# Patient Record
Sex: Female | Born: 1955 | Race: Black or African American | Hispanic: No | Marital: Married | Smoking: Former smoker
Health system: Southern US, Community
[De-identification: ages and names within clinical notes are randomized; demographics above are authoritative.]

## PROBLEM LIST (undated history)

## (undated) DIAGNOSIS — K219 Gastro-esophageal reflux disease without esophagitis: Secondary | ICD-10-CM

## (undated) DIAGNOSIS — F419 Anxiety disorder, unspecified: Secondary | ICD-10-CM

## (undated) DIAGNOSIS — E119 Type 2 diabetes mellitus without complications: Secondary | ICD-10-CM

## (undated) DIAGNOSIS — I82409 Acute embolism and thrombosis of unspecified deep veins of unspecified lower extremity: Secondary | ICD-10-CM

## (undated) DIAGNOSIS — M1990 Unspecified osteoarthritis, unspecified site: Secondary | ICD-10-CM

## (undated) DIAGNOSIS — I10 Essential (primary) hypertension: Secondary | ICD-10-CM

## (undated) DIAGNOSIS — K449 Diaphragmatic hernia without obstruction or gangrene: Secondary | ICD-10-CM

## (undated) DIAGNOSIS — E079 Disorder of thyroid, unspecified: Secondary | ICD-10-CM

## (undated) DIAGNOSIS — G43909 Migraine, unspecified, not intractable, without status migrainosus: Secondary | ICD-10-CM

## (undated) DIAGNOSIS — K828 Other specified diseases of gallbladder: Secondary | ICD-10-CM

## (undated) DIAGNOSIS — A048 Other specified bacterial intestinal infections: Secondary | ICD-10-CM

## (undated) DIAGNOSIS — Z5189 Encounter for other specified aftercare: Secondary | ICD-10-CM

## (undated) DIAGNOSIS — K589 Irritable bowel syndrome without diarrhea: Secondary | ICD-10-CM

## (undated) DIAGNOSIS — T7840XA Allergy, unspecified, initial encounter: Secondary | ICD-10-CM

## (undated) DIAGNOSIS — J45909 Unspecified asthma, uncomplicated: Secondary | ICD-10-CM

## (undated) DIAGNOSIS — K269 Duodenal ulcer, unspecified as acute or chronic, without hemorrhage or perforation: Secondary | ICD-10-CM

## (undated) DIAGNOSIS — K635 Polyp of colon: Secondary | ICD-10-CM

## (undated) DIAGNOSIS — D689 Coagulation defect, unspecified: Secondary | ICD-10-CM

## (undated) DIAGNOSIS — D126 Benign neoplasm of colon, unspecified: Secondary | ICD-10-CM

## (undated) DIAGNOSIS — M797 Fibromyalgia: Secondary | ICD-10-CM

## (undated) DIAGNOSIS — E785 Hyperlipidemia, unspecified: Secondary | ICD-10-CM

## (undated) HISTORY — DX: Encounter for other specified aftercare: Z51.89

## (undated) HISTORY — DX: Duodenal ulcer, unspecified as acute or chronic, without hemorrhage or perforation: K26.9

## (undated) HISTORY — DX: Fibromyalgia: M79.7

## (undated) HISTORY — PX: CHOLECYSTECTOMY: SHX55

## (undated) HISTORY — DX: Unspecified asthma, uncomplicated: J45.909

## (undated) HISTORY — DX: Migraine, unspecified, not intractable, without status migrainosus: G43.909

## (undated) HISTORY — PX: LAPAROSCOPIC ABDOMINAL EXPLORATION: SHX6249

## (undated) HISTORY — PX: SIGMOIDOSCOPY: SUR1295

## (undated) HISTORY — PX: COLONOSCOPY: SHX174

## (undated) HISTORY — DX: Irritable bowel syndrome, unspecified: K58.9

## (undated) HISTORY — PX: ABDOMINAL HYSTERECTOMY: SHX81

## (undated) HISTORY — DX: Allergy, unspecified, initial encounter: T78.40XA

## (undated) HISTORY — DX: Other specified diseases of gallbladder: K82.8

## (undated) HISTORY — DX: Polyp of colon: K63.5

## (undated) HISTORY — DX: Benign neoplasm of colon, unspecified: D12.6

## (undated) HISTORY — DX: Acute embolism and thrombosis of unspecified deep veins of unspecified lower extremity: I82.409

## (undated) HISTORY — DX: Gastro-esophageal reflux disease without esophagitis: K21.9

## (undated) HISTORY — DX: Disorder of thyroid, unspecified: E07.9

## (undated) HISTORY — DX: Anxiety disorder, unspecified: F41.9

## (undated) HISTORY — DX: Unspecified osteoarthritis, unspecified site: M19.90

## (undated) HISTORY — DX: Hyperlipidemia, unspecified: E78.5

## (undated) HISTORY — DX: Other specified bacterial intestinal infections: A04.8

## (undated) HISTORY — DX: Essential (primary) hypertension: I10

## (undated) HISTORY — PX: TONSILLECTOMY: SUR1361

## (undated) HISTORY — DX: Diaphragmatic hernia without obstruction or gangrene: K44.9

## (undated) HISTORY — PX: THYROID SURGERY: SHX805

## (undated) HISTORY — DX: Coagulation defect, unspecified: D68.9

## (undated) HISTORY — DX: Type 2 diabetes mellitus without complications: E11.9

## (undated) HISTORY — PX: UPPER GASTROINTESTINAL ENDOSCOPY: SHX188

---

## 2004-07-24 ENCOUNTER — Ambulatory Visit: Payer: Self-pay | Admitting: Internal Medicine

## 2006-08-28 ENCOUNTER — Ambulatory Visit: Payer: Self-pay | Admitting: Internal Medicine

## 2006-09-20 ENCOUNTER — Encounter: Payer: Self-pay | Admitting: Internal Medicine

## 2006-09-20 ENCOUNTER — Ambulatory Visit: Payer: Self-pay | Admitting: Internal Medicine

## 2006-10-20 ENCOUNTER — Encounter: Payer: Self-pay | Admitting: Internal Medicine

## 2008-01-27 ENCOUNTER — Encounter: Payer: Self-pay | Admitting: Internal Medicine

## 2009-03-31 ENCOUNTER — Encounter: Payer: Self-pay | Admitting: Internal Medicine

## 2009-11-09 ENCOUNTER — Encounter (INDEPENDENT_AMBULATORY_CARE_PROVIDER_SITE_OTHER): Payer: Self-pay | Admitting: *Deleted

## 2009-11-09 ENCOUNTER — Telehealth: Payer: Self-pay | Admitting: Internal Medicine

## 2009-11-21 DIAGNOSIS — IMO0001 Reserved for inherently not codable concepts without codable children: Secondary | ICD-10-CM | POA: Insufficient documentation

## 2009-11-21 DIAGNOSIS — K449 Diaphragmatic hernia without obstruction or gangrene: Secondary | ICD-10-CM | POA: Insufficient documentation

## 2009-11-21 DIAGNOSIS — G43909 Migraine, unspecified, not intractable, without status migrainosus: Secondary | ICD-10-CM | POA: Insufficient documentation

## 2009-11-21 DIAGNOSIS — E039 Hypothyroidism, unspecified: Secondary | ICD-10-CM | POA: Insufficient documentation

## 2009-11-21 DIAGNOSIS — Z8711 Personal history of peptic ulcer disease: Secondary | ICD-10-CM

## 2009-11-21 DIAGNOSIS — K625 Hemorrhage of anus and rectum: Secondary | ICD-10-CM

## 2009-11-21 DIAGNOSIS — Z860100 Personal history of colon polyps, unspecified: Secondary | ICD-10-CM | POA: Insufficient documentation

## 2009-11-21 DIAGNOSIS — K589 Irritable bowel syndrome without diarrhea: Secondary | ICD-10-CM

## 2009-11-21 DIAGNOSIS — I10 Essential (primary) hypertension: Secondary | ICD-10-CM | POA: Insufficient documentation

## 2009-11-21 DIAGNOSIS — F411 Generalized anxiety disorder: Secondary | ICD-10-CM | POA: Insufficient documentation

## 2009-11-21 DIAGNOSIS — Z8719 Personal history of other diseases of the digestive system: Secondary | ICD-10-CM

## 2009-11-21 DIAGNOSIS — K5900 Constipation, unspecified: Secondary | ICD-10-CM | POA: Insufficient documentation

## 2009-11-21 DIAGNOSIS — R110 Nausea: Secondary | ICD-10-CM

## 2009-11-21 DIAGNOSIS — F450 Somatization disorder: Secondary | ICD-10-CM

## 2009-11-21 DIAGNOSIS — K828 Other specified diseases of gallbladder: Secondary | ICD-10-CM

## 2010-01-17 ENCOUNTER — Encounter: Payer: Self-pay | Admitting: Internal Medicine

## 2010-01-17 ENCOUNTER — Telehealth: Payer: Self-pay | Admitting: Internal Medicine

## 2010-02-02 ENCOUNTER — Ambulatory Visit: Payer: Self-pay | Admitting: Internal Medicine

## 2010-02-02 DIAGNOSIS — R109 Unspecified abdominal pain: Secondary | ICD-10-CM | POA: Insufficient documentation

## 2010-02-02 DIAGNOSIS — M545 Low back pain: Secondary | ICD-10-CM

## 2010-02-02 LAB — CONVERTED CEMR LAB
Basophils Relative: 0 % (ref 0–1)
Hemoglobin: 12.7 g/dL (ref 12.0–15.0)
Lymphocytes Relative: 64 % — ABNORMAL HIGH (ref 12–46)
MCHC: 32.6 g/dL (ref 30.0–36.0)
Monocytes Absolute: 0.5 10*3/uL (ref 0.1–1.0)
Monocytes Relative: 6 % (ref 3–12)
Neutro Abs: 2.4 10*3/uL (ref 1.7–7.7)
Neutrophils Relative %: 29 % — ABNORMAL LOW (ref 43–77)
RBC: 4.31 M/uL (ref 3.87–5.11)
WBC: 8.3 10*3/uL (ref 4.0–10.5)

## 2010-02-03 LAB — CONVERTED CEMR LAB
ALT: 34 units/L (ref 0–35)
BUN: 15 mg/dL (ref 6–23)
CO2: 34 meq/L — ABNORMAL HIGH (ref 19–32)
Calcium: 10.1 mg/dL (ref 8.4–10.5)
Chloride: 100 meq/L (ref 96–112)
Creatinine, Ser: 0.6 mg/dL (ref 0.4–1.2)
GFR calc non Af Amer: 132.46 mL/min (ref >60)
Glucose, Bld: 64 mg/dL — ABNORMAL LOW (ref 70–99)
Sed Rate: 26 mm/hr — ABNORMAL HIGH (ref 0–22)
TSH: 0.24 microintl units/mL — ABNORMAL LOW (ref 0.35–5.50)
Total Bilirubin: 0.3 mg/dL (ref 0.3–1.2)

## 2010-02-06 ENCOUNTER — Encounter: Payer: Self-pay | Admitting: Internal Medicine

## 2010-02-15 ENCOUNTER — Encounter: Payer: Self-pay | Admitting: Internal Medicine

## 2010-02-16 ENCOUNTER — Ambulatory Visit: Payer: Self-pay | Admitting: Internal Medicine

## 2010-02-20 ENCOUNTER — Encounter: Payer: Self-pay | Admitting: Internal Medicine

## 2010-02-21 ENCOUNTER — Telehealth (INDEPENDENT_AMBULATORY_CARE_PROVIDER_SITE_OTHER): Payer: Self-pay

## 2010-03-02 ENCOUNTER — Telehealth: Payer: Self-pay | Admitting: Internal Medicine

## 2010-03-03 ENCOUNTER — Telehealth: Payer: Self-pay | Admitting: Internal Medicine

## 2010-05-11 ENCOUNTER — Ambulatory Visit: Payer: Self-pay | Admitting: Internal Medicine

## 2010-06-27 NOTE — Procedures (Signed)
Summary: Upper Endoscopy  Patient: Mindie Rawdon Note: All result statuses are Final unless otherwise noted.  Tests: (1) Upper Endoscopy (EGD)   EGD Upper Endoscopy       DONE     [facility]     [address]     [place], [place]  [address]           ENDOSCOPY PROCEDURE REPORT           PATIENT:  Cyndia, Degraff  MR#:  [id number]     BIRTHDATE:  [birth year], 54 yrs. old  GENDER:  female           ENDOSCOPIST:  Hedwig Morton. Juanda Chance, MD     Referred by:  Christena Flake, M.D.           PROCEDURE DATE:  02/16/2010     PROCEDURE:  EGD with biopsy     ASA CLASS:  Class II     INDICATIONS:  nausea and vomiting duodenal ulcer in 2004,     duodenitis in 2008     she has been on Mobic           MEDICATIONS:   Versed 10 mg, Fentanyl 100 mcg     TOPICAL ANESTHETIC:  Exactacain Spray           DESCRIPTION OF PROCEDURE:   After the risks benefits and     alternatives of the procedure were thoroughly explained, informed     consent was obtained.  The LB GIF-H180 [id number] endoscope was     introduced through the mouth and advanced to the second portion of     the duodenum, without limitations.  The instrument was slowly     withdrawn as the mucosa was fully examined.     <<PROCEDUREIMAGES>>           Esophagitis was found in the distal esophagus. 2 linear erosion     With standard forceps, a biopsy was obtained and sent to pathology     (see image1, image2, image11, and image12).  Mild gastritis was     found. With standard forceps, a biopsy was obtained and sent to     pathology (see image8, image7, image9, and image10).  Duodenitis     was found. With standard forceps, a biopsy was obtained and sent     to pathology (see image6, image4, and image5).    Retroflexed     views revealed no abnormalities.    The scope was then withdrawn     from the patient and the procedure completed.           COMPLICATIONS:  None           ENDOSCOPIC IMPRESSION:     1) Esophagitis in the distal  esophagus     2) Mild gastritis     3) Duodenitis     s/p multiple biopsies, ? ethiology? r/o Mobic related,     RECOMMENDATIONS:     1) Await biopsy results     increase Prelosec to bid, # 60, 3 refills     Carafte 1 gm bid, continue           REPEAT EXAM:  In 0 year(s) for.           ______________________________     Hedwig Morton. Juanda Chance, MD           CC:           n.     eSIGNED:  Hedwig Morton. Brodie at 02/16/2010 03:19 PM           Page 2 of 3   Symia, Herdt, [id number]  Note: An exclamation mark (!) indicates a result that was not dispersed into the flowsheet. Document Creation Date: 02/16/2010 3:19 PM _______________________________________________________________________  (1) Order result status: Final Collection or observation date-time: 02/16/2010 15:07 Requested date-time:  Receipt date-time:  Reported date-time:  Referring Physician:   Ordering Physician: Lina Sar [phone]) Specimen Source:  Source: Launa Grill Order Number: [id number] Lab site:

## 2010-06-27 NOTE — Letter (Signed)
Summary: EGD Instructions  [facility]  [address] [place], [place] [address]   Phone: [phone]  Fax: [phone]       [facility] Riquelme    1956-02-09    MRN: [id number]       Procedure Day /Date:02/14/10 Tuesday     Arrival Time: 1:30 pm     Procedure Time: 2:30 pm     Location of Procedure:                    _ x_ [facility] (4th Floor)  PREPARATION FOR ENDOSCOPY   On 02/14/10 THE DAY OF THE PROCEDURE:  1.   No solid foods, milk or milk products are allowed after midnight the night before your procedure.  2.   Do not drink anything colored red or purple.  Avoid juices with pulp.  No orange juice.  3.  You may drink clear liquids until 12:30 pm, which is 2 hours before your procedure.                                                                                                CLEAR LIQUIDS INCLUDE: Water Jello Ice Popsicles Tea (sugar ok, no milk/cream) Powdered fruit flavored drinks Coffee (sugar ok, no milk/cream) Gatorade Juice: apple, white grape, white cranberry  Lemonade Clear bullion, consomm, broth Carbonated beverages (any kind) Strained chicken noodle soup Hard Candy   MEDICATION INSTRUCTIONS  Unless otherwise instructed, you should take regular prescription medications with a small sip of water as early as possible the morning of your procedure.                 OTHER INSTRUCTIONS  You will need a responsible adult at least 53 years of age to accompany you and drive you home.   This person must remain in the waiting room during your procedure.  Wear loose fitting clothing that is easily removed.  Leave jewelry and other valuables at home.  However, you may wish to bring a book to read or an iPod/MP3 player to listen to music as you wait for your procedure to start.  Remove all body piercing jewelry and leave at home.  Total time from sign-in until discharge is approximately 2-3 hours.  You should go  home directly after your procedure and rest.  You can resume normal activities the day after your procedure.  The day of your procedure you should not:   Drive   Make legal decisions   Operate machinery   Drink alcohol   Return to work  You will receive specific instructions about eating, activities and medications before you leave.    The above instructions have been reviewed and explained to me by  Lamona Curl CMA Duncan Dull)  February 02, 2010 12:36 PM     I fully understand and can verbalize these instructions _____________________________ Date 02/02/10

## 2010-06-27 NOTE — Progress Notes (Signed)
Summary: Triage  Medications Added FLUCONAZOLE 150 MG TABS (FLUCONAZOLE) 1 tablet by mouth       Phone Note Call from Patient Call back at Home Phone [phone]   Caller: Patient Call For: Dr. Juanda Chance Reason for Call: Talk to Nurse Summary of Call: Is on a antibotic and has a "yeast" infection...wants to know if something can be prescribed also requesting to speak to nurse Initial call taken by: Karna Christmas,  March 03, 2010 9:22 AM  Follow-up for Phone Call        Patient is being treaed for h pylori with a prev-pak.  Since starting on this she c/o vaginal yeast infection.  Patient  also states she has some diarrhea since starting antibiotics.  I have advised her to resume her donnatal as previously prescribed.  Dr Arlyce Dice you are MD of the day please advise tx for vaginal yeast infection. Follow-up by: Darcey Nora RN, CGRN,  March 03, 2010 9:32 AM  Additional Follow-up for Phone Call Additional follow up Details #1::        flucanazole 150mg  x 1 dose try florastor 1 once daily x 10 days Additional Follow-up by: Louis Meckel MD,  March 03, 2010 12:00 PM    Additional Follow-up for Phone Call Additional follow up Details #2::    I have left a voicemail with Dr Marzetta Board recommendations.  I have asked her to call me back if she has any questions. Follow-up by: Darcey Nora RN, CGRN,  March 03, 2010 1:12 PM  New/Updated Medications: FLUCONAZOLE 150 MG TABS (FLUCONAZOLE) 1 tablet by mouth Prescriptions: FLUCONAZOLE 150 MG TABS (FLUCONAZOLE) 1 tablet by mouth  #1 x 0   Entered by:   Darcey Nora RN, CGRN   Authorized by:   Louis Meckel MD   Signed by:   Darcey Nora RN, CGRN on 03/03/2010   Method used:   Electronically to        [facility]* (retail)       [address]       [place], [place]  [address]       Ph: [phone]       Fax: [phone]   RxID:   [id number]

## 2010-06-27 NOTE — Progress Notes (Signed)
Summary: Biopsy results   Phone Note Call from Patient Call back at Home Phone [phone]   Caller: Patient Call For: Dr. Juanda Chance Reason for Call: Lab or Test Results Summary of Call: Calling about Biopsy results and treatment plan Initial call taken by: Karna Christmas,  March 02, 2010 9:51 AM  Follow-up for Phone Call        reviewed, patient started on Prev-pack yesterday.  REV scheduled for 04/17/10 11:00 Follow-up by: Darcey Nora RN, CGRN,  March 02, 2010 2:06 PM

## 2010-06-27 NOTE — Progress Notes (Signed)
Summary: H pylori tx  Medications Added PREVPAC  MISC (AMOXICILL-CLARITHRO-LANSOPRAZ) take as drected       ---- Converted from flag ---- ---- 02/20/2010 7:44 PM, Hart Carwin MD wrote: Harriet Masson, Mrs Lauf will be calling You to get a prescription for H.Pylori treatment. Prevpal is ok unless she is allergic to PCN. ------------------------------  Phone Note Outgoing Call Call back at [facility] Phone [phone]   Call placed by: Darcey Nora RN, CGRN,  February 21, 2010 10:49 AM Call placed to: Patient Summary of Call: I have reviewed path results and Dr Regino Schultze orders with the patient  Initial call taken by: Darcey Nora RN, CGRN,  February 21, 2010 10:54 AM    New/Updated Medications: PREVPAC  MISC (AMOXICILL-CLARITHRO-LANSOPRAZ) take as drected Prescriptions: PREVPAC  MISC (AMOXICILL-CLARITHRO-LANSOPRAZ) take as drected  #1 pack x 0   Entered by:   Darcey Nora RN, CGRN   Authorized by:   Hart Carwin MD   Signed by:   Darcey Nora RN, CGRN on 02/21/2010   Method used:   Electronically to        [facility]* (retail)       [address]       [place], [place]  [address]       Ph: [phone]       Fax: [phone]   RxID:   [id number]

## 2010-06-27 NOTE — Consult Note (Signed)
Summary: [facility]  [facility]   Imported By: Sherian Rein 02/24/2010 08:31:41  _____________________________________________________________________  External Attachment:    Type:   Image     Comment:   External Document

## 2010-06-27 NOTE — Miscellaneous (Signed)
Summary: donnatal rx.  Clinical Lists Changes  Medications: Added new medication of DONNATAL   TABS (BELLADONNA ALK-PHENOBARBITAL) take 1 tab by mouth three times a day before meals. - Signed Rx of DONNATAL   TABS (BELLADONNA ALK-PHENOBARBITAL) take 1 tab by mouth three times a day before meals.;  #60 x 3;  Signed;  Entered by: Darlyn Read RN;  Authorized by: Hart Carwin MD;  Method used: Electronically to [facility]*, [address], [place], [place]  [address], Ph: [phone], Fax: [phone]    Prescriptions: DONNATAL   TABS (BELLADONNA ALK-PHENOBARBITAL) take 1 tab by mouth three times a day before meals.  #60 x 3   Entered by:   Darlyn Read RN   Authorized by:   Hart Carwin MD   Signed by:   Darlyn Read RN on 02/16/2010   Method used:   Electronically to        [facility]* (retail)       [address]       [place], [place]  [address]       Ph: [phone]       Fax: [phone]   RxID:   [id number]

## 2010-06-27 NOTE — Assessment & Plan Note (Signed)
Summary: diarrhea/sheri    History of Present Illness Visit Type: Initial Visit Primary GI MD: Lina Sar MD Primary Dieter Hane: Christena Flake, MD Chief Complaint: abdominal cramping, constipation/diarrhea History of Present Illness:   This is a 55 year old African American female with alternating diarrhea and constipation, bloating and abdominal pain. We saw her in 2004 and subsequently in 2008 for similar problems. She had a hyperplastic polyp of the colon in 2004 on a colonoscopy and a duodenal ulcer on endoscopy in 2004. She underwent a cholecystectomy in March 2006. In April 2008, she was evaluated for intractable nausea, vomiting and abdominal pain and again underwent an upper endoscopy with findings of duodenitis. Her colonoscopy for diarrhea showed normal mucosa without evidence of microscopic colitis. She has continued to gain weight despite trying to lose weight, as she says doing her part. Analysis of her eating habits reveals that she eats three meals a day of healthy food. She complains of early satiety. Additional problems include fibromyalgia, hypertension and increased anxiety state. She is currently complaining of lower back pain which limits her and her exercise. She has been on Mobic 7.5 mg daily.    GI Review of Systems    Reports abdominal pain, acid reflux, belching, bloating, chest pain, nausea, and  vomiting.     Location of  Abdominal pain: generalized.    Denies dysphagia with liquids, dysphagia with solids, heartburn, loss of appetite, vomiting blood, weight loss, and  weight gain.      Reports black tarry stools, change in bowel habits, constipation, diarrhea, diverticulosis, hemorrhoids, irritable bowel syndrome, and  rectal bleeding.     Denies anal fissure, fecal incontinence, heme positive stool, jaundice, light color stool, liver problems, and  rectal pain. Preventive Screening-Counseling & Management  Alcohol-Tobacco     Smoking Status: quit    Current  Medications (verified): 1)  Donnatal  Tabs (Belladonna Alk-Phenobarbital) .... Take 1 Tablet By Mouth Three Times A Day Before Meals. Must Have Office Visit For Further Refills! 2)  Prilosec Otc 20 Mg Tbec (Omeprazole Magnesium) .... Once Daily 3)  Carafate 1 Gm Tabs (Sucralfate) .... Take 2 Tablets By Mouth Once Daily 4)  Miralax  Powd (Polyethylene Glycol 3350) .... Take As Directed 5)  Promethazine Hcl 50 Mg Tabs (Promethazine Hcl) .... Two Times A Day 6)  Premarin 1.25 Mg Tabs (Estrogens Conjugated) .... Once Daily 7)  Lisinopril 10 Mg Tabs (Lisinopril) .... Two Times A Day 8)  Synthroid 88 Mcg Tabs (Levothyroxine Sodium) .... Once Daily 9)  Xanax 1 Mg Tabs (Alprazolam) .... As Needed 10)  Tramadol Hcl 50 Mg Tabs (Tramadol Hcl) .... As Needed 11)  Flexeril 10 Mg Tabs (Cyclobenzaprine Hcl) .... As Needed 12)  Mobic 7.5 Mg Tabs (Meloxicam) .... As Needed  Allergies: 1)  ! Erythromycin 2)  ! Tetracycline  Past History:  Past Medical History: Reviewed history from 11/21/2009 and no changes required. 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 Family History of Breast Cancer: Mother Family History of Diabetes: Mother  Social History: Illicit Drug Use - no Patient is a former smoker.  Daily Caffeine Use Smoking Status:  quit  Review of  Systems       The patient complains of allergy/sinus, arthritis/joint pain, back pain, blood in urine, cough, fatigue, headaches-new, muscle pains/cramps, night sweats, sleeping problems, urination - excessive, and urine leakage.  The patient denies anemia, anxiety-new, breast changes/lumps, change in vision, confusion, coughing up blood, depression-new, fainting, fever, hearing problems, heart murmur, heart rhythm changes, itching, menstrual pain, nosebleeds, pregnancy symptoms, shortness of breath, skin rash, sore throat, swelling of feet/legs, swollen lymph glands, thirst - excessive , urination - excessive , urination changes/pain, vision changes, and voice change.         Pertinent positive and negative review of systems were noted in the above HPI. All other ROS was otherwise negative.   Vital Signs:  Patient profile:   55 year old female Height:      60 inches Weight:      222.13 pounds BMI:     43.54 Pulse rate:   68 / minute Pulse rhythm:   regular BP sitting:   130 / 72  (left arm) Cuff size:   large  Vitals Entered By: June McMurray CMA Duncan Dull) (February 02, 2010 11:27 AM)  Physical Exam  General:  alert, oriented in no distress. Overweight. Eyes:  PERRLA, no icterus. Mouth:  No deformity or lesions, dentition normal. Neck:  Supple; no masses or thyromegaly. Lungs:  Clear throughout to auscultation. Heart:  Regular rate and rhythm; no murmurs, rubs,  or bruits. Abdomen:  large, obese protuberant abdomen with normal active bowel sounds with minimal tenderness diffusely with no palpable organs and no scars other than a post laparoscopic cholecystectomy. There is no CVA tenderness. Rectal:  normal rectal tone. There is no stool in the rectal ampulla. A small sample of mucus is Hemoccult-negative. Skin:  Intact without significant lesions or rashes. Psych:  Alert and cooperative. Normal mood and affect.   Impression & Recommendations:  Problem # 1:  IRRITABLE BOWEL SYNDROME  (ICD-564.1) Patient has irregular alternating bowel habits consistent with irritable bowel syndrome and the patient who continues to gain weight despite her claiming to have good eating habits. She claims limitation of activity due to lower back pain and early satiety. Clearly, her nutritional intake must be adequate since she has not lost any weight.  Problem # 2:  DUODENAL ULCER, HX OF (ICD-V12.71) Patient had a duodenal ulcer in 2004 and duodenitis on an endoscopy 2008. Patient has been on Mobic and has been having dyspepsia, belching and indigestion. We will proceed with an upper endoscopy to rule out an NSAID related duodenal ulcer. She is to continue Carafate 1 g twice a day and Prilosec 20 mg daily.  Problem # 3:  ABDOMINAL PAIN, UNSPECIFIED SITE (ICD-789.00) we will proceed with a CT scan of the abdomen and pelvis with IV and oral contrast to rule out any structural abnormality of her abdomen. Orders: CT Abdomen/Pelvis with Contrast (CT Abd/Pelvis w/con) T- * Misc. Laboratory test [id number]) EGD (EGD) TLB-TSH (Thyroid Stimulating Hormone) (84443-TSH) TLB-CMP (Comprehensive Metabolic Pnl) (80053-COMP) TLB-Sedimentation Rate (ESR) (85652-ESR)  Other Orders: Orthopedic Referral (Ortho)  Patient Instructions: 1)  You have been scheduled for a CT scan of the abdomen and pelvis with IV and oral contrast at [facility] (per your request). You should arrive at registrationat 4:00 pm for your 4:30 pm test. You have been given written prep instructions. 2)  Upper endoscopy and biopsies has been scheduled. 3)  You should take Prilosec 20 mg  daily. 4)  Carafate 1 g p.o. b.i.d. 5)  We have scheduled you for an appointment with an orthopedic specialist (Dr Ethelene Hal) for evaluation of back pain. You have been scheduled for 02/15/10 @ 3:00 pm. They should send you new patient paperwork. 6)  Continue Donnatal tablets. 7)  Lab work today including TSH, metabolic panel, CBC, sedimentation  rate. 8)  Again, patient encouraged to exercise and loose weight. 9)  Copy sent to : Dr Christena Flake 10)  The medication list was reviewed and reconciled.  All changed / newly prescribed medications were explained.  A complete medication list was provided to the patient / caregiver. Prescriptions: CARAFATE 1 GM TABS (SUCRALFATE) Take 1 tablet by mouth two times a day  #60 x 2   Entered by:   Lamona Curl CMA (AAMA)   Authorized by:   Hart Carwin MD   Signed by:   Lamona Curl CMA (AAMA) on 02/02/2010   Method used:   Historical   RxID:   [id number]

## 2010-06-27 NOTE — Progress Notes (Signed)
Summary: Abd Pain   Phone Note Call from Patient Call back at Home Phone [phone] Call back at or [phone]   Call For: Dr Juanda Chance Reason for Call: Talk to Nurse Summary of Call: Cramps and pain in her stomach. Alot of diarrhea for like 1 month seems to be getting worse. Wanted to schedule appt with Dr Juanda Chance but nothing until Nov and feels she cant wait until then. Initial call taken by: Leanor Kail [facility],  January 17, 2010 1:30 PM  Follow-up for Phone Call        Patient  c/o diarrhea, abdominal pain,  and back pain.  Patient  is requesting an earlier appointment.  She is scheduled to see Dr Juanda Chance 02/02/10 at 11:30.  New patient letter mailed to the patient's home.   Follow-up by: Darcey Nora RN, CGRN,  January 17, 2010 1:47 PM

## 2010-06-27 NOTE — Letter (Signed)
Summary: [facility] Biopsy Results  [facility]  [address] [place], [place] [address]   Phone: [phone]  Fax: [phone]        February 20, 2010 MRN: [id number]    [facility] [address] [place], [place]  [address]    Dear Ms. Maeder,  I am pleased to inform you that the biopsies taken during your recent endoscopic examination did not show any evidence of cancer upon pathologic examination.The biopsies fromYour stomach showed H.Pylori infection. It ought to be treated.   Additional information/recommendations:  __No further action is needed at this time.  Please follow-up with      your primary care physician for your other healthcare needs.  _x_ Please call [phone] to schedule a return visit to review      your condition. Please call [phone] to speak with Sherri, RN to discuss medication for the H.Pylori. __      Please call us if you are having persistent problems or have questions about your condition that have not been fully answered at this time.  Sincerely,  Hart Carwin MD  This letter has been electronically signed by your physician.  Appended Document: Patient Notice-Endo Biopsy Results letter mailed

## 2010-06-27 NOTE — Procedures (Signed)
Summary: EGD   EGD  Procedure date:  10/20/2006  Findings:      Location: [facility]   Patient Name: Sandy Ballard, Sandy Ballard MRN:  Procedure Procedures: Panendoscopy (EGD) CPT: 43235.    with biopsy(s)/brushing(s). CPT: D1846139.  Personnel: Endoscopist: Dora L. Juanda Chance, MD.  Exam Location: Exam performed in Outpatient Clinic. Outpatient  Patient Consent: Procedure, Alternatives, Risks and Benefits discussed, consent obtained, from patient. Consent was obtained by the RN.  Indications Symptoms: Nausea. Vomiting. Abdominal pain, location: epigastric.  Surveillance of: Duodenal ulcer. 2004.  History  Current Medications: Patient is not currently taking Coumadin.  Pre-Exam Physical: Performed Sep 20, 2006  Cardio-pulmonary exam, HEENT exam, Abdominal exam, Extremity exam, Neurological exam, Mental status exam WNL.  Comments: Pt. history reviewed/updated, physical exam performed prior to initiation of sedation?yes Exam Exam Info: Maximum depth of insertion Duodenum, intended Duodenum. Vocal cords visualized. Gastric retroflexion performed. Images taken. ASA Classification: I. Tolerance: good.  Sedation Meds: Patient assessed and found to be appropriate for moderate (conscious) sedation. Fentanyl 50 mcg. given IV. Versed 8 mg. given IV. Cetacaine Spray 2 sprays given aerosolized.  Monitoring: BP and pulse monitoring done. Oximetry used. Supplemental O2 given  Findings Normal: Fundus.  - ESOPHAGEAL INFLAMMATION: suspected as a result of reflux. Severity is mild, erythema only.  [place] Classification: Grade 0. Biopsy/Esoph Inflamtn taken. ICD9: Esophagitis, Reflux: 530.11.  - MUCOSAL ABNORMALITY: Duodenal Bulb to Duodenal Apex. Erosions present. Erythematous mucosa. Friable mucosa. Biopsy/Mucosal Abn taken. ICD9: Duodenitis without Hemorrhage: 535.60.  Normal: Antrum.  Normal: Duodenal Apex.   Assessment Abnormal examination, see findings above.    Diagnoses: 530.11: Esophagitis, Reflux.  535.60: Duodenitis without Hemorrhage.   Comments: duodenitis, moderately severe, s/p biopsies Events  Unplanned Intervention: No unplanned interventions were required.  Unplanned Events: There were no complications. Plans Medication(s): Await pathology. PPI: Omeprazole/Prilosec 20 mg BID, starting Sep 20, 2006  Carafate: 1gm BID, starting Sep 20, 2006   Comments: No ASA/NSAIDs ,Caffeine or alcohol Disposition: After procedure patient sent to recovery. After recovery patient sent home.   This report was created from the original endoscopy report, which was reviewed and signed by the above listed endoscopist.    FINAL DIAGNOSIS    ***MICROSCOPIC EXAMINATION AND DIAGNOSIS***    1. DUODENUM, BIOPSY: CHRONIC DUODENITIS. SEE COMMENT.    2. ESOPHAGOGASTRIC JUNCTION, BIOPSY: GASTROESOPHAGEAL JUNCTION   MUCOSA WITH MILD INFLAMMATION CONSISTENT WITH GASTROESOPHAGEAL   REFLUX. NO INTESTINAL METAPLASIA, DYSPLASIA OR MALIGNANCY   IDENTIFIED.    3. COLON, BIOPSY, SIGMOID: NO ACTIVE COLITIS OR DYSPLASIA   IDENTIFIED.    COMMENT   1. 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   Microhemorrhages are present in the upper lamina propria.   Definite villous atrophy and increased intraepithelial   lymphocytes, changes characteristic of celiac sprue, are not   identified. No evidence of dysplasia or malignancy is seen.    2. An Alcian Blue stain is performed to determine the presence   of intestinal metaplasia (goblet cell metaplasia). No intestinal   metaplasia (goblet cell metaplasia) is identified with the Alcian   Blue stain. The control stained appropriately.    3. There is colorectal mucosa with normal crypt architecture.   No active inflammation, microscopic colitis, collagenous colitis   or significant chronic changes identified. No hyperplastic or    adenomatous changes are seen, and there is no evidence of   malignancy. (RC:caf 09/23/06)    cf  Date Reported: 09/23/2006 Clearance Coots, MD   *** Electronically Signed Out By [facility] ***    Clinical information   HX of polyps, heme + stools   1. Duodenitis   2. R/O Barrett' s   3. R/O microscopic colitis (caf)    specimen(s) obtained   1: Duodenum, biopsy   2: Esophagogastric junction, biopsy   3: Colon, biopsy, sigmoid    Gross Description   1. Received in formalin are tan, soft tissue fragments that are   submitted in toto. Number: three.   Size: 0.3 to 0.4 cm. One block.    2. Received in formalin are tan, soft tissue fragments that are   submitted in toto. Number: three.   Size: 0.3 to 0.4 cm. One block.    3. Received in formalin are tan, soft tissue fragments that are   submitted in toto. Number: three.   Size: 0.2 to 0.3 cm. One block. (TB:caf 09/20/06)    cf/

## 2010-06-27 NOTE — Procedures (Signed)
Summary: COLON   Colonoscopy  Procedure date:  10/20/2006  Findings:      Location:  [facility].   Patient Name: Sandy Ballard, Sandy Ballard MRN:  Procedure Procedures: Colonoscopy CPT: [phone].    with biopsy. CPT: Q5068410.  Personnel: Endoscopist: Kaydra Borgen L. Juanda Chance, MD.  Exam Location: Exam performed in Outpatient Clinic. Outpatient  Patient Consent: Procedure, Alternatives, Risks and Benefits discussed, consent obtained, from patient. Consent was obtained by the RN.  Indications Symptoms: Diarrhea Patient is having increased frequency of stools. Abdominal pain / bloating.  Surveillance of: 2004.  Comments: recent hospitalization for suspected SBO, abd. pain subsided without surgery History  Current Medications: Patient is not currently taking Coumadin.  Pre-Exam Physical: Performed Sep 20, 2006. Cardio-pulmonary exam, Rectal exam, HEENT exam , Abdominal exam, Extremity exam, Neurological exam, Mental status exam WNL.  Comments: Sandy Ballard. history reviewed/updated, physical exam performed prior to initiation of sedation?yes Exam Exam: Extent of exam reached: Cecum, extent intended: Cecum.  The cecum was identified by appendiceal orifice and IC valve. Images taken. ASA Classification: I. Tolerance: good.  Monitoring: Pulse and BP monitoring, Oximetry used. Supplemental O2 given.  Colon Prep Used Miralax for colon prep. Prep results: good.  Sedation Meds: Patient assessed and found to be appropriate for moderate (conscious) sedation. Fentanyl 50 mcg. given IV. Versed 4 mg. given IV.  Findings - DIAGNOSTIC TEST: Biopsies taken. from Descending Colon to Sigmoid Colon. Reason: r/o microscopic colitis.  - NORMAL EXAM:  - NORMAL EXAM: Rectum.    Comments: scope withdrawal time 7:49 min Assessment Normal examination.  Comments: nothing to account for the diarrhea, Events  Unplanned Interventions: No intervention was required.  Unplanned Events: There were no  complications. Plans Medication Plan: Await pathology. Antispasmodics: Librax 3 ac, starting Sep 20, 2006   Disposition: After procedure patient sent to recovery. After recovery patient sent home.  Scheduling/Referral: Clinic Visit, to [facility] L. Juanda Chance, MD, first available, about 6 weeks,    This report was created from the original endoscopy report, which was reviewed and signed by the above listed endoscopist.    FINAL DIAGNOSIS    ***MICROSCOPIC EXAMINATION AND DIAGNOSIS***    1. DUODENUM, BIOPSY: CHRONIC DUODENITIS. SEE COMMENT.    2. ESOPHAGOGASTRIC JUNCTION, BIOPSY: GASTROESOPHAGEAL JUNCTION   MUCOSA WITH MILD INFLAMMATION CONSISTENT WITH GASTROESOPHAGEAL   REFLUX. NO INTESTINAL METAPLASIA, DYSPLASIA OR MALIGNANCY   IDENTIFIED.    3. COLON, BIOPSY, SIGMOID: NO ACTIVE COLITIS OR DYSPLASIA   IDENTIFIED.    COMMENT   1. 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   Microhemorrhages are present in the upper lamina propria.   Definite villous atrophy and increased intraepithelial   lymphocytes, changes characteristic of celiac sprue, are not   identified. No evidence of dysplasia or malignancy is seen.    2. An Alcian Blue stain is performed to determine the presence   of intestinal metaplasia (goblet cell metaplasia). No intestinal   metaplasia (goblet cell metaplasia) is identified with the Alcian   Blue stain. The control stained appropriately.    3. There is colorectal mucosa with normal crypt architecture.   No active inflammation, microscopic colitis, collagenous colitis   or significant chronic changes identified. No hyperplastic or   adenomatous changes are seen, and there is no evidence of   malignancy. (RC:caf 09/23/06)    cf   Date Reported: 09/23/2006 Clearance Coots, MD   *** Electronically Signed Out By [facility] ***  Clinical information   HX of polyps, heme + stools   1. Duodenitis    2. R/O Barrett' s   3. R/O microscopic colitis (caf)    specimen(s) obtained   1: Duodenum, biopsy   2: Esophagogastric junction, biopsy   3: Colon, biopsy, sigmoid    Gross Description   1. Received in formalin are tan, soft tissue fragments that are   submitted in toto. Number: three.   Size: 0.3 to 0.4 cm. One block.    2. Received in formalin are tan, soft tissue fragments that are   submitted in toto. Number: three.   Size: 0.3 to 0.4 cm. One block.    3. Received in formalin are tan, soft tissue fragments that are   submitted in toto. Number: three.   Size: 0.2 to 0.3 cm. One block. (TB:caf 09/20/06)    cf/

## 2010-06-27 NOTE — Letter (Signed)
Summary: New Patient letter  [facility]  [address] [place], [place] [address]   Phone: [phone]  Fax: [phone]       01/17/2010 MRN: [id number]  [facility] [place] [address] [place], [place]  [address]  Dear Ms. Casler,  Welcome to the Gastroenterology Division at [facility]    You are scheduled to see Dr.  Juanda Chance  on 02/02/10 at 11:30 on the 3rd floor at [facility], [address]  We ask that you try to arrive at our office 15 minutes prior to your appointment time to allow for check-in.  We would like you to complete the enclosed self-administered evaluation form prior to your visit and bring it with you on the day of your appointment.  We will review it with you.  Also, please bring a complete list of all your medications or, if you prefer, bring the medication bottles and we will list them.  Please bring your insurance card so that we may make a copy of it.  If your insurance requires a referral to see a specialist, please bring your referral form from your primary care physician.  Co-payments are due at the time of your visit and may be paid by cash, check or credit card.     Your office visit will consist of a consult with your physician (includes a physical exam), any laboratory testing he/she may order, scheduling of any necessary diagnostic testing (e.g. x-ray, ultrasound, CT-scan), and scheduling of a procedure (e.g. Endoscopy, Colonoscopy) if required.  Please allow enough time on your schedule to allow for any/all of these possibilities.    If you cannot keep your appointment, please call [phone] to cancel or reschedule prior to your appointment date.  This allows [place] the opportunity to schedule an appointment for another patient in need of care.  If you do not cancel or reschedule by 5 p.m. the business day prior to your appointment date, you will be charged a $50.00 late cancellation/no-show fee.    Thank you for choosing  [facility] for your medical needs.  We appreciate the opportunity to care for you.  Please visit [place] at our website  to learn more about our practice.                     Sincerely,                                                             The Gastroenterology Division

## 2010-06-27 NOTE — Miscellaneous (Signed)
Summary: Prilosec increase  Clinical Lists Changes  Medications: Added new medication of PRILOSEC 20 MG CPDR (OMEPRAZOLE) Increase Prilosec to BID - Signed Added new medication of DIFLUCAN 100 MG TABS (FLUCONAZOLE) 1 tablet by mouth daily for 3 days - Signed Rx of PRILOSEC 20 MG CPDR (OMEPRAZOLE) Increase Prilosec to BID;  #60 x 3;  Signed;  Entered by: Grier Rocher, RN;  Authorized by: Hart Carwin MD;  Method used: Electronically to [facility]*, [address] [place], [place]  [address], Ph: [phone], Fax: [phone] Rx of DIFLUCAN 100 MG TABS (FLUCONAZOLE) 1 tablet by mouth daily for 3 days;  #3 x 0;  Signed;  Entered by: Grier Rocher, RN;  Authorized by: Hart Carwin MD;  Method used: Electronically to [facility]*, [address], [place], [place]  [address], Ph: [phone], Fax: [phone] Observations: Added new observation of ALLERGY REV: Done (02/16/2010 15:34)    Prescriptions: DIFLUCAN 100 MG TABS (FLUCONAZOLE) 1 tablet by mouth daily for 3 days  #3 x 0   Entered by:   Grier Rocher, RN   Authorized by:   Hart Carwin MD   Signed by:   Grier Rocher, RN on 02/16/2010   Method used:   Electronically to        [facility]* (retail)       [address]       [place], [place]  [address]       Ph: [phone]       Fax: [phone]   RxID:   [id number] PRILOSEC 20 MG CPDR (OMEPRAZOLE) Increase Prilosec to BID  #60 x 3   Entered by:   Grier Rocher, RN   Authorized by:   Hart Carwin MD   Signed by:   Grier Rocher, RN on 02/16/2010   Method used:   Electronically to        [facility]* (retail)       [address]       [place], [place]  [address]       Ph: [phone]       Fax: [phone]   RxID:   [id number]

## 2010-06-27 NOTE — Letter (Signed)
Summary: New Patient letter  [facility]  [address] [place], [place] [address]   Phone: [phone]  Fax: [phone]       11/09/2009 MRN: [id number]  [facility] [place] [address] [place], [place]  [address]  Dear Ms. Kapler,  Welcome to the Gastroenterology Division at [facility].    You are scheduled to see Dr. Lina Sar on 11-29-09 at 11:30am,  on the 3rd floor at [facility], [address].  We ask that you try to arrive at our office 15 minutes prior to your appointment time to allow for check-in.  We would like you to complete the enclosed self-administered evaluation form prior to your visit and bring it with you on the day of your appointment.  We will review it with you.  Also, please bring a complete list of all your medications or, if you prefer, bring the medication bottles and we will list them.  Please bring your insurance card so that we may make a copy of it.  If your insurance requires a referral to see a specialist, please bring your referral form from your primary care physician.  Co-payments are due at the time of your visit and may be paid by cash, check or credit card.     Your office visit will consist of a consult with your physician (includes a physical exam), any laboratory testing he/she may order, scheduling of any necessary diagnostic testing (e.g. x-ray, ultrasound, CT-scan), and scheduling of a procedure (e.g. Endoscopy, Colonoscopy) if required.  Please allow enough time on your schedule to allow for any/all of these possibilities.    If you cannot keep your appointment, please call [phone] to cancel or reschedule prior to your appointment date.  This allows [place] the opportunity to schedule an appointment for another patient in need of care.  If you do not cancel or reschedule by 5 p.m. the business day prior to your appointment date, you will be charged a $50.00 late cancellation/no-show fee.    Thank you for  choosing [facility] for your medical needs.  We appreciate the opportunity to care for you.  Please visit [place] at our website  to learn more about our practice.                     Sincerely,                                                             The Gastroenterology Division   Appended Document: New Patient letter Letter mailed to patient.

## 2010-06-27 NOTE — Progress Notes (Signed)
Summary: TRIAGE   Phone Note Call from Patient Call back at [phone] cell   Call For: Dr Juanda Chance Reason for Call: Talk to Nurse Summary of Call: Hr stomach is killing her and would like an appointment as soon as possible. Initial call taken by: Leanor Kail [facility],  November 09, 2009 11:40 AM  Follow-up for Phone Call        Last OV 08-28-2006. Pt.  c/o constant nausea, "It has never stopped."  Takes an OTC acid reducer daily and  Phenergan 25mg  2-3 daily. Also c/o increased gas/bloating, worse after meals. Some lower abd. pain. Chronic constipation, uses OTC meds as needed.  Sees BRB on the tissue after every BM, sometimes before a BM. Uses Prep. H supp. as needed. **Pt. declines an appt. until July.  1) See Dr.Jahfari Ambers on 11-29-09 at 11:30am 2) Gas-x,Phazyme, etc. as needed for gas & bloating.  Beano before a meal that you know causes gas. 3) Miralax 17gm mixed in 8oz. water or juice-daily.May use BID PRN. 4) Prep.H supp. per rectum at bedtime for 7 nights. 5) Continue other meds as directed 6) If symptoms become worse call back immediately or go to ER.   Follow-up by: Laureen Ochs LPN,  November 09, 2009 12:05 PM  Additional Follow-up for Phone Call Additional follow up Details #1::        she has a ? hx of SBO, please obtain KUB to r/o acute process Additional Follow-up by: Hart Carwin MD,  November 09, 2009 1:17 PM    Additional Follow-up for Phone Call Additional follow up Details #2::    Above MD orders reviewed with patient. Pt. states, "I might be able to run down there one day next week. Please call me back tomorrow so I can let you know." Laureen Ochs LPN  November 09, 2009 3:07 PM   Pt. spoke w/her PCP, he feels she may be having a UTI, per information from last appt. w/him. He is referring her to a Insurance underwriter in [place]. Pt. will see if her PCP will order her KUB locally. Pt. will keep f/u appt. w/Dr.Lisa Blakeman and callback as need.

## 2010-10-13 NOTE — Assessment & Plan Note (Signed)
[facility]                         GASTROENTEROLOGY OFFICE NOTE   NAME:Grandinetti, DANIELA SIEBERS                     MRN:          [id number]  DATE:08/28/2006                            DOB:          16-Jun-1955    Ms. Torry is a very nice, 55 year old, African-American female here  today after hospitalization at [facility] for what was thought to  be small-bowel obstruction. We saw Ms. Dinges initially in  August 2004  for abdominal pain, nausea and vomiting and history of duodenal ulcer.  Her upper endoscopy showed duodenitis without hemorrhage. CLOtest was  negative. Her colon exam at that time showed diminutive polyp of the  left colon to be hyperplastic polyp on path exam. Since then, the  patient underwent laparoscopic cholecystectomy in 2006. About a month  ago, she started having crampy abdominal pain, increasing nausea and  vomiting. She denied having constipation although her hospital record on  admission stated that she was constipated. She was admitted to [facility] after 2 emergency room visits with dehydration and abnormal KUB  showing a somewhat dilated colon. Her records show that there was no  dilatation of the small bowel. Prior CT scan of the abdomen done in  [place] apparently was normal but we do not have those records and  the study was done before she was admitted to [facility]. She  also , according to her, was having a fever of about 102 when admitted  to the hospital. While in the hospital, the patient  was treated  with  enemas, no nasal gastric tube. Her lab work showed normal white cell  count and hemoglobin but she was heme positive on physical examination.   She was discharged on Reglan, Prilosec 20 mg a day, Phenergan, Librax  as an antispasmodic.   She has been somewhat improved but still has daily crampy abdominal pain  and nausea postprandially.   MEDICATIONS:  1. Phenergan 25 mg p.r.n.  2.  Premarin 1.25 mg p.o. daily.  3. Synthroid 0.088 mg daily.  4. Xanax 0.5 mg t.i.d.  5. Flexeril 10 mg p.o. t.i.d.  6. Darvocet-N 100.  7. Carafate 2 grams twice a day.  8. Reglan 5 mg 3 times a day.  9. Clidinium 3 times a day a.c.  10.Omeprazole 20 mg p.o. daily.   PHYSICAL EXAMINATION:  VITAL SIGNS:  Blood pressure 118/72, pulse 88 and  weight 221 pounds.  GENERAL:  The patient was mildly distressed, somewhat anxious,  overweight.  LUNGS:  Clear to auscultation.  COR:  Rapid S1 and S2.  NECK:  Supple, no thyroid enlargement.  HEENT:  Oral cavity was normal.  ABDOMEN:  Obese, soft with somewhat quiet bowel sounds. Tenderness in  left lower quadrant, right lower quadrant and diffusely throughout all  quadrants. There was no rebound. The left lower quadrant was somewhat  tender than the right lower quadrant. No palpable mass. Surgical scars  in suprapubic area and post laparoscopic cholecystectomy.  RECTAL:  Normal rectal tone, no stool in the rectal vault. Mucous was  heme negative.   IMPRESSION:  A 55 year old African-American female with  acute illness  requiring hospitalization at [facility]. There was only partial  improvement. Tentative diagnosis small-bowel obstruction but review of  the records reveals that there was no documentation radiographically  that there was an obstruction. The differential diagnosis includs  adynamic ileus, colonic ileus, partial small-bowel obstruction, post  cholecystectomy syndrome or recurrent duodenal ulcer.   PLAN:  1. Upper endoscopy and colonoscopy scheduled.  2. Increase Omeprazole to 20 mg p.o. b.i.d.  3. Obtain stool cultures, O&P and C difficile toxin to evaluate her      diarrhea.  4. Continue Clidinium and Phenergan p.r.n., stop Reglan which may be      causing diarrhea.  5. Cipro 250 p.o. b.i.d. x1 week for bacterial overgrowth.     Hedwig Morton. Juanda Chance, MD  Electronically Signed    DMB/MedQ  DD: 08/28/2006  DT:  08/28/2006  Job #: [id number]   cc:   Veryl Speak, MD

## 2010-10-17 ENCOUNTER — Ambulatory Visit: Payer: Self-pay | Admitting: Internal Medicine

## 2014-01-07 ENCOUNTER — Telehealth: Payer: Self-pay | Admitting: Internal Medicine

## 2014-01-07 NOTE — Telephone Encounter (Signed)
Spoke with patient and she thought she had a stomach virus last week but she has continued to have bloating, nausea and back aching. She is taking Levsin, Carafate, Nexium and Promethazine. States she still has nausea and bloating. Scheduled with Tye Savoy, NP on 01/13/14 at 3:00 PM. Patient is asking is there is anything she can take until then.

## 2014-01-07 NOTE — Telephone Encounter (Signed)
Pt had a DU in the past. Please increase PPI to bid. Send Phenergan12.5mg  #15, 1 po q 6 hrs prn nausea, no refill.

## 2014-01-08 MED ORDER — PROMETHAZINE HCL 25 MG PO TABS: 12.5000 mg | ORAL_TABLET | Freq: Four times a day (QID) | ORAL | Status: AC | PRN

## 2014-01-08 MED ORDER — ESOMEPRAZOLE MAGNESIUM 40 MG PO CPDR: 40.0000 mg | DELAYED_RELEASE_CAPSULE | Freq: Every day | ORAL | Status: DC

## 2014-01-08 NOTE — Telephone Encounter (Signed)
Patient notified I stressed the importance of keeping the importance with Tye Savoy RNP next week.

## 2014-01-13 ENCOUNTER — Ambulatory Visit: Payer: Self-pay | Admitting: Nurse Practitioner

## 2014-01-20 ENCOUNTER — Encounter: Payer: Self-pay | Admitting: Internal Medicine

## 2014-01-21 ENCOUNTER — Encounter: Payer: Self-pay | Admitting: *Deleted

## 2014-03-23 ENCOUNTER — Ambulatory Visit: Payer: Self-pay | Admitting: Internal Medicine

## 2014-10-20 ENCOUNTER — Telehealth: Payer: Self-pay | Admitting: Internal Medicine

## 2014-10-20 NOTE — Telephone Encounter (Signed)
Spoke with patient and she states she is going to see her PCP on 11/08/14 and will get a referral to [place] at that time. She will call back to schedule after this.

## 2014-11-11 ENCOUNTER — Encounter: Payer: Self-pay | Admitting: Internal Medicine

## 2014-12-07 ENCOUNTER — Encounter: Payer: Self-pay | Admitting: Internal Medicine

## 2015-02-16 ENCOUNTER — Ambulatory Visit: Payer: Self-pay | Admitting: Internal Medicine

## 2015-03-17 ENCOUNTER — Ambulatory Visit: Payer: Self-pay | Admitting: Gastroenterology

## 2015-08-02 ENCOUNTER — Encounter: Payer: Self-pay | Admitting: Gastroenterology

## 2015-09-14 ENCOUNTER — Ambulatory Visit: Payer: Self-pay | Admitting: Gastroenterology

## 2015-12-07 ENCOUNTER — Ambulatory Visit: Payer: Self-pay | Admitting: Gastroenterology

## 2015-12-30 ENCOUNTER — Ambulatory Visit: Payer: Self-pay | Admitting: Gastroenterology

## 2016-04-11 ENCOUNTER — Ambulatory Visit: Payer: Self-pay | Admitting: Gastroenterology

## 2016-06-08 ENCOUNTER — Ambulatory Visit (INDEPENDENT_AMBULATORY_CARE_PROVIDER_SITE_OTHER): Payer: Managed Care, Other (non HMO) | Admitting: Gastroenterology

## 2016-06-08 ENCOUNTER — Encounter: Payer: Self-pay | Admitting: Gastroenterology

## 2016-06-08 VITALS — BP 156/78 | HR 88 | Ht 60.0 in | Wt 232.0 lb

## 2016-06-08 DIAGNOSIS — Z1211 Encounter for screening for malignant neoplasm of colon: Secondary | ICD-10-CM | POA: Diagnosis not present

## 2016-06-08 DIAGNOSIS — K580 Irritable bowel syndrome with diarrhea: Secondary | ICD-10-CM

## 2016-06-08 DIAGNOSIS — K2100 Gastro-esophageal reflux disease with esophagitis, without bleeding: Secondary | ICD-10-CM

## 2016-06-08 DIAGNOSIS — R1013 Epigastric pain: Secondary | ICD-10-CM

## 2016-06-08 DIAGNOSIS — R110 Nausea: Secondary | ICD-10-CM

## 2016-06-08 MED ORDER — RANITIDINE HCL 300 MG PO TABS: 300.0000 mg | ORAL_TABLET | Freq: Every day | ORAL | 6 refills | Status: DC

## 2016-06-08 MED ORDER — HYOSCYAMINE SULFATE 0.125 MG PO TABS: 0.1250 mg | ORAL_TABLET | Freq: Four times a day (QID) | ORAL | 3 refills | Status: DC | PRN

## 2016-06-08 MED ORDER — NA SULFATE-K SULFATE-MG SULF 17.5-3.13-1.6 GM/177ML PO SOLN: 1.0000 | Freq: Once | ORAL | 0 refills | Status: AC

## 2016-06-08 MED ORDER — ESOMEPRAZOLE MAGNESIUM 40 MG PO CPDR: 40.0000 mg | DELAYED_RELEASE_CAPSULE | Freq: Every day | ORAL | 6 refills | Status: DC

## 2016-06-08 NOTE — Progress Notes (Signed)
Sandy Ballard    [id number]    11/17/1955  Primary Care Physician:ZIMMER,WILLIAM, MD  Referring Physician: No referring provider defined for this encounter.  Chief complaint:  GERD, epigastric abdominal pain, diarrhea, nausea  HPI: 61 year old morbidly obese female previously followed by Dr. Delfin Edis is here to establish care. She was last seen in December 2011. She continues to have significant breakthrough heartburn almost daily despite Nexium once daily. She is also taking Carafate 2 tablets twice daily. Denies any nsaids or excessive alcohol use. She has gained a significant weight in the past few years and is struggling to lose the weight. Also complained of intermittent nausea and epigastric abdominal pain. No vomiting. She also complains of increased bowel frequency, has to go to bathroom after every meal. No nocturnal symptoms and denies loose watery bowel movements. No melena or blood per rectum   Outpatient Encounter Prescriptions as of 06/08/2016  Medication Sig  . albuterol (PROVENTIL HFA;VENTOLIN HFA) 108 (90 Base) MCG/ACT inhaler Inhale 1 puff into the lungs every 4 (four) hours as needed.  Marland Kitchen albuterol (PROVENTIL) (2.5 MG/3ML) 0.083% nebulizer solution Inhale 2.5 mg into the lungs every 6 (six) hours as needed.  . ALPRAZolam (XANAX) 0.5 MG tablet Take 1 tablet by mouth 4 (four) times daily as needed.  Marland Kitchen amLODipine (NORVASC) 5 MG tablet Take 1 tablet by mouth daily.  Marland Kitchen aspirin 81 MG chewable tablet Chew 1 tablet by mouth daily.  . budesonide-formoterol (SYMBICORT) 160-4.5 MCG/ACT inhaler Inhale 2 puffs into the lungs 2 (two) times daily.  . carvedilol (COREG) 25 MG tablet Take 1 tablet by mouth 2 (two) times daily.  . cyclobenzaprine (FLEXERIL) 10 MG tablet Take 1 tablet by mouth 3 (three) times daily.  Marland Kitchen esomeprazole (NEXIUM) 40 MG capsule Take 1 capsule (40 mg total) by mouth daily at 12 noon.  Marland Kitchen esomeprazole (NEXIUM) 40 MG capsule Take 1 capsule by mouth  daily.  . folic acid (FOLVITE) 1 MG tablet Take 1 tablet by mouth daily.  Marland Kitchen gabapentin (NEURONTIN) 400 MG capsule Take 1 capsule by mouth 3 (three) times daily.  . hydrochlorothiazide (HYDRODIURIL) 25 MG tablet Take 1 tablet by mouth daily.  . hyoscyamine (LEVSIN, ANASPAZ) 0.125 MG tablet Take 1 tablet by mouth every 6 (six) hours as needed.  Marland Kitchen levothyroxine (SYNTHROID, LEVOTHROID) 50 MCG tablet Take 1 tablet by mouth daily.  . metFORMIN (GLUCOPHAGE) 500 MG tablet Take 1 tablet by mouth 2 (two) times daily.  . potassium chloride SA (K-DUR,KLOR-CON) 20 MEQ tablet Take 1 tablet by mouth daily.  . promethazine (PHENERGAN) 25 MG tablet Take 0.5 tablets (12.5 mg total) by mouth every 6 (six) hours as needed for nausea or vomiting.  . promethazine (PHENERGAN) 25 MG tablet Take 1 tablet by mouth every 6 (six) hours as needed.  . ranitidine (ZANTAC) 150 MG tablet Take 150 mg by mouth 2 (two) times daily.  . sucralfate (CARAFATE) 1 g tablet Take 2 tablets by mouth 2 (two) times daily.  . traZODone (DESYREL) 50 MG tablet Take 50 mg by mouth at bedtime.  . Vitamin D, Ergocalciferol, (DRISDOL) 50000 units CAPS capsule Take 1 capsule by mouth once a week.   No facility-administered encounter medications on file as of 06/08/2016.     Allergies as of 06/08/2016 - Review Complete 06/08/2016  Allergen Reaction Noted  . Lisinopril Anaphylaxis 06/08/2016  . Erythromycin  11/21/2009  . Tetracycline  11/21/2009    Past Medical History:  Diagnosis Date  . Anxiety   . Biliary dyskinesia   . Duodenal ulcer   . Duodenal ulcer   . Fibromyalgia   . GERD (gastroesophageal reflux disease)   . Hiatal hernia   . Hyperplastic colon polyp   . Hypertension   . IBS (irritable bowel syndrome)   . Migraine     Past Surgical History:  Procedure Laterality Date  . ABDOMINAL HYSTERECTOMY    . CHOLECYSTECTOMY    . LAPAROSCOPIC ABDOMINAL EXPLORATION    . THYROID SURGERY    . TONSILLECTOMY      Family History    Problem Relation Age of Onset  . Breast cancer Mother   . Diabetes Mother     Social History   Social History  . Marital status: Married    Spouse name: N/A  . Number of children: N/A  . Years of education: N/A   Occupational History  . Not on file.   Social History Main Topics  . Smoking status: Former Smoker    Quit date: 01/04/2014  . Smokeless tobacco: Never Used  . Alcohol use No  . Drug use: No  . Sexual activity: Not on file   Other Topics Concern  . Not on file   Social History Narrative  . No narrative on file      Review of systems: Review of Systems  Constitutional: Negative for fever and chills.  HENT: Negative.   Eyes: Negative for blurred vision.  Respiratory: Negative for cough, shortness of breath and wheezing.   Cardiovascular: Negative for chest pain and palpitations.  Gastrointestinal: as per HPI Genitourinary: Negative for dysuria, urgency, positive for frequency Musculoskeletal: Positive for myalgias, back pain and joint pain.  Skin: Negative for itching and rash.  Neurological: Negative for dizziness, tremors, focal weakness, seizures and loss of consciousness.  Endo/Heme/Allergies: Positive for seasonal allergies.  Psychiatric/Behavioral: Negative for depression, suicidal ideas and hallucinations.  All other systems reviewed and are negative.   Physical Exam: Vitals:   06/08/16 0930  BP: (!) 156/78  Pulse: 88   Body mass index is 45.31 kg/m. Gen:      No acute distress HEENT:  EOMI, sclera anicteric Neck:     No masses; no thyromegaly Lungs:    Clear to auscultation bilaterally; normal respiratory effort CV:         Regular rate and rhythm; no murmurs Abd:      + bowel sounds; soft, non-tender; no palpable masses, no distension Ext:    No edema; adequate peripheral perfusion Skin:      Warm and dry; no rash Neuro: alert and oriented x 3 Psych: normal mood and affect  Data Reviewed:  Reviewed labs, radiology imaging, old  records and pertinent past GI work up  EGD September 2011: Esophagitis in distal esophagus, and gastroduodenitis  Colonoscopy April 2008: Normal  Assessment and Plan/Recommendations: 61 year old female with complaints of persistent GERD symptoms despite PPI daily, epigastric pain, nausea and increased bowel frequency  GERD: Given persistent symptoms associated with nausea and epigastric pain , will schedule for EGD for further evaluation The risks and benefits as well as alternatives of endoscopic procedure(s) have been discussed and reviewed. All questions answered. The patient agrees to proceed. Continue Nexium 40 mg daily, 30 minutes before breakfast Add Zantac 300 mg daily at bedtime Antireflux measures Discussed diet and exercise  We'll also schedule colonoscopy along with EGD as patient is due for screening colonoscopy for colorectal cancer  Increased bowel frequency/diarrhea likely irritable bowel  syndrome, patient has no alarming symptoms If continues to have persistent symptoms we'll consider starting cholestyramine for possible bile salt diarrhea  Patient was given information regarding weight loss program and bariatric surgery  Greater than 50% of the time used for counseling as well as treatment plan and follow-up. She had multiple questions which were answered to her satisfaction  K. Denzil Magnuson , MD [phone] Mon-Fri 8a-5p [phone] after 5p, weekends, holidays  CC: No ref. provider found

## 2016-06-08 NOTE — Patient Instructions (Signed)
You have been scheduled for an endoscopy and colonoscopy. Please follow the written instructions given to you at your visit today. Please pick up your prep supplies at the pharmacy within the next 1-3 days. If you use inhalers (even only as needed), please bring them with you on the day of your procedure.   Take Nexium 40 mg 30 minutes before breakfast  Zantac 300 daily at bedtime   Follow up as needed

## 2016-06-27 ENCOUNTER — Encounter: Payer: Self-pay | Admitting: Gastroenterology

## 2016-06-27 ENCOUNTER — Encounter: Payer: Managed Care, Other (non HMO) | Admitting: Gastroenterology

## 2016-06-27 ENCOUNTER — Ambulatory Visit (AMBULATORY_SURGERY_CENTER): Payer: Managed Care, Other (non HMO) | Admitting: Gastroenterology

## 2016-06-27 VITALS — BP 143/66 | HR 94 | Temp 96.2°F | Resp 16 | Ht 60.0 in | Wt 232.0 lb

## 2016-06-27 DIAGNOSIS — K2990 Gastroduodenitis, unspecified, without bleeding: Secondary | ICD-10-CM | POA: Diagnosis not present

## 2016-06-27 DIAGNOSIS — K2980 Duodenitis without bleeding: Secondary | ICD-10-CM

## 2016-06-27 DIAGNOSIS — K209 Esophagitis, unspecified without bleeding: Secondary | ICD-10-CM

## 2016-06-27 DIAGNOSIS — Z1212 Encounter for screening for malignant neoplasm of rectum: Secondary | ICD-10-CM | POA: Diagnosis not present

## 2016-06-27 DIAGNOSIS — K2970 Gastritis, unspecified, without bleeding: Secondary | ICD-10-CM

## 2016-06-27 DIAGNOSIS — R1013 Epigastric pain: Secondary | ICD-10-CM | POA: Diagnosis not present

## 2016-06-27 DIAGNOSIS — D123 Benign neoplasm of transverse colon: Secondary | ICD-10-CM | POA: Diagnosis not present

## 2016-06-27 DIAGNOSIS — Z1211 Encounter for screening for malignant neoplasm of colon: Secondary | ICD-10-CM | POA: Diagnosis not present

## 2016-06-27 MED ORDER — SODIUM CHLORIDE 0.9 % IV SOLN: 500.0000 mL | INTRAVENOUS | Status: DC

## 2016-06-27 NOTE — Patient Instructions (Signed)
YOU HAD AN ENDOSCOPIC PROCEDURE TODAY AT [facility]:   Refer to the procedure report that was given to you for any specific questions about what was found during the examination.  If the procedure report does not answer your questions, please call your gastroenterologist to clarify.  If you requested that your care partner not be given the details of your procedure findings, then the procedure report has been included in a sealed envelope for you to review at your convenience later.  YOU SHOULD EXPECT: Some feelings of bloating in the abdomen. Passage of more gas than usual.  Walking can help get rid of the air that was put into your GI tract during the procedure and reduce the bloating. If you had a lower endoscopy (such as a colonoscopy or flexible sigmoidoscopy) you may notice spotting of blood in your stool or on the toilet paper. If you underwent a bowel prep for your procedure, you may not have a normal bowel movement for a few days.  Please Note:  You might notice some irritation and congestion in your nose or some drainage.  This is from the oxygen used during your procedure.  There is no need for concern and it should clear up in a day or so.  SYMPTOMS TO REPORT IMMEDIATELY:   Following lower endoscopy (colonoscopy or flexible sigmoidoscopy):  Excessive amounts of blood in the stool  Significant tenderness or worsening of abdominal pains  Swelling of the abdomen that is new, acute  Fever of 100F or higher   Following upper endoscopy (EGD)  Vomiting of blood or coffee ground material  New chest pain or pain under the shoulder blades  Painful or persistently difficult swallowing  New shortness of breath  Fever of 100F or higher  Black, tarry-looking stools  For urgent or emergent issues, a gastroenterologist can be reached at any hour by calling [phone].   DIET:  We do recommend a small meal at first, but then you may proceed to your regular diet.  Drink  plenty of fluids but you should avoid alcoholic beverages for 24 hours.  ACTIVITY:  You should plan to take it easy for the rest of today and you should NOT DRIVE or use heavy machinery until tomorrow (because of the sedation medicines used during the test).    FOLLOW UP: Our staff will call the number listed on your records the next business day following your procedure to check on you and address any questions or concerns that you may have regarding the information given to you following your procedure. If we do not reach you, we will leave a message.  However, if you are feeling well and you are not experiencing any problems, there is no need to return our call.  We will assume that you have returned to your regular daily activities without incident.  If any biopsies were taken you will be contacted by phone or by letter within the next 1-3 weeks.  Please call us at [phone] if you have not heard about the biopsies in 3 weeks.    Await for biopsy results to determine next repeat Colonoscopy Gastric (handout given) Polyps (handout given) Hemorrhoids (handout given)   SIGNATURES/CONFIDENTIALITY: You and/or your care partner have signed paperwork which will be entered into your electronic medical record.  These signatures attest to the fact that that the information above on your After Visit Summary has been reviewed and is understood.  Full responsibility of the confidentiality of this  discharge information lies with you and/or your care-partner. 

## 2016-06-27 NOTE — Progress Notes (Signed)
Called to room to assist during endoscopic procedure.  Patient ID and intended procedure confirmed with present staff. Received instructions for my participation in the procedure from the performing physician.  

## 2016-06-27 NOTE — Op Note (Signed)
[facility] Patient Name: Sandy Ballard Procedure Date: 06/27/2016 1:52 PM MRN: [id number] Endoscopist: Mauri Pole , MD Age: 61 Referring MD:  Date of Birth: September 22, 1955 Gender: Female Account #: [id number] Procedure:                Upper GI endoscopy Indications:              Esophageal reflux symptoms that persist despite                            appropriate therapy, Upper abdominal symptoms that                            persist despite an appropriate trial of therapy,                            Epigastric abdominal pain, Heartburn Medicines:                Monitored Anesthesia Care Procedure:                Pre-Anesthesia Assessment:                           - Prior to the procedure, a History and Physical                            was performed, and patient medications and                            allergies were reviewed. The patient's tolerance of                            previous anesthesia was also reviewed. The risks                            and benefits of the procedure and the sedation                            options and risks were discussed with the patient.                            All questions were answered, and informed consent                            was obtained. Prior Anticoagulants: The patient has                            taken no previous anticoagulant or antiplatelet                            agents. ASA Grade Assessment: II - A patient with                            mild systemic disease. After reviewing the risks  and benefits, the patient was deemed in                            satisfactory condition to undergo the procedure.                           - Prior to the procedure, a History and Physical                            was performed, and patient medications and                            allergies were reviewed. The patient's tolerance of                            previous anesthesia  was also reviewed. The risks                            and benefits of the procedure and the sedation                            options and risks were discussed with the patient.                            All questions were answered, and informed consent                            was obtained. Prior Anticoagulants: The patient has                            taken no previous anticoagulant or antiplatelet                            agents. ASA Grade Assessment: II - A patient with                            mild systemic disease. After reviewing the risks                            and benefits, the patient was deemed in                            satisfactory condition to undergo the procedure.                           After obtaining informed consent, the endoscope was                            passed under direct vision. Throughout the                            procedure, the patient's blood pressure, pulse, and  oxygen saturations were monitored continuously. The                            Model GIF-[id number] scope was introduced                            through the mouth, and advanced to the second part                            of duodenum. The upper GI endoscopy was                            accomplished without difficulty. The patient                            tolerated the procedure well. Scope In: Scope Out: Findings:                 LA Grade C (one or more mucosal breaks continuous                            between tops of 2 or more mucosal folds, less than                            75% circumference) esophagitis with no bleeding was                            found 34 to 35 cm from the incisors. Irregular                            Z-line. Small hiatal hernia ~2cm.                           Scattered mild inflammation characterized by                            congestion (edema), erythema and granularity was                             found in the entire examined stomach. Biopsies were                            taken with a cold forceps for Helicobacter pylori                            testing using CLOtest.                           Patchy mild inflammation characterized by                            congestion (edema) and erythema was found in the                            first  portion of the duodenum.                           The second portion of the duodenum was normal. Complications:            No immediate complications. Estimated Blood Loss:     Estimated blood loss was minimal. Impression:               - LA Grade C reflux esophagitis.                           - Gastritis. Biopsied.                           - Duodenitis.                           - Normal second portion of the duodenum. Recommendation:           - Patient has a contact number available for                            emergencies. The signs and symptoms of potential                            delayed complications were discussed with the                            patient. Return to normal activities tomorrow.                            Written discharge instructions were provided to the                            patient.                           - Resume previous diet.                           - Continue present medications.                           - Await pathology results.                           - No aspirin, ibuprofen, naproxen, or other                            non-steroidal anti-inflammatory drugs.                           - Follow an antireflux regimen and PPI Mauri Pole, MD 06/27/2016 2:26:18 PM This report has been signed electronically.

## 2016-06-27 NOTE — Progress Notes (Signed)
Pt is complaining of itching to both upper arms. Pt has some small whelps to upper arm. Dr. Silverio Decamp notified advised to have her take benadryl when she gets home. Sherlon Handing CRNA notified and went to assess pt complaints. Advised to take Benadryl when she arrives home. If no better please call the number listed on the discharge form.

## 2016-06-27 NOTE — Progress Notes (Signed)
Pt's states no medical or surgical changes since previsit or office visit. 

## 2016-06-27 NOTE — Op Note (Signed)
[facility] Patient Name: Sandy Ballard Procedure Date: 06/27/2016 1:52 PM MRN: [id number] Endoscopist: Mauri Pole , MD Age: 61 Referring MD:  Date of Birth: 1955-10-12 Gender: Female Account #: [id number] Procedure:                Colonoscopy Indications:              Screening for colorectal malignant neoplasm Medicines:                Monitored Anesthesia Care Procedure:                Pre-Anesthesia Assessment:                           - Prior to the procedure, a History and Physical                            was performed, and patient medications and                            allergies were reviewed. The patient's tolerance of                            previous anesthesia was also reviewed. The risks                            and benefits of the procedure and the sedation                            options and risks were discussed with the patient.                            All questions were answered, and informed consent                            was obtained. Prior Anticoagulants: The patient has                            taken no previous anticoagulant or antiplatelet                            agents. ASA Grade Assessment: II - A patient with                            mild systemic disease. After reviewing the risks                            and benefits, the patient was deemed in                            satisfactory condition to undergo the procedure.                           After obtaining informed consent, the colonoscope  was passed under direct vision. Throughout the                            procedure, the patient's blood pressure, pulse, and                            oxygen saturations were monitored continuously. The                            Model CF-[id number]) scope was introduced                            through the anus and advanced to the the cecum,                            identified by  appendiceal orifice and ileocecal                            valve. The colonoscopy was performed without                            difficulty. The patient tolerated the procedure                            well. The quality of the bowel preparation was                            excellent. The ileocecal valve, appendiceal                            orifice, and rectum were photographed. Scope In: 2:03:19 PM Scope Out: 2:18:08 PM Scope Withdrawal Time: 0 hours 12 minutes 57 seconds  Total Procedure Duration: 0 hours 14 minutes 49 seconds  Findings:                 The perianal and digital rectal examinations were                            normal.                           Two sessile polyps were found in the transverse                            colon. The polyps were 4 to 5 mm in size. These                            polyps were removed with a cold snare. Resection                            and retrieval were complete.                           Non-bleeding internal hemorrhoids were found during  retroflexion. The hemorrhoids were small.                           The exam was otherwise without abnormality. Complications:            No immediate complications. Estimated Blood Loss:     Estimated blood loss was minimal. Impression:               - Two 4 to 5 mm polyps in the transverse colon,                            removed with a cold snare. Resected and retrieved.                           - Non-bleeding internal hemorrhoids.                           - The examination was otherwise normal. Recommendation:           - Patient has a contact number available for                            emergencies. The signs and symptoms of potential                            delayed complications were discussed with the                            patient. Return to normal activities tomorrow.                            Written discharge instructions were provided to  the                            patient.                           - Resume previous diet.                           - Continue present medications.                           - Await pathology results.                           - Repeat colonoscopy in 5-10 years for surveillance                            based on pathology results.                           - Return to GI clinic PRN. Mauri Pole, MD 06/27/2016 2:22:39 PM This report has been signed electronically.

## 2016-06-27 NOTE — Progress Notes (Signed)
To recovery, report to Jones, RN, VSS 

## 2016-06-28 ENCOUNTER — Telehealth: Payer: Self-pay | Admitting: *Deleted

## 2016-06-28 ENCOUNTER — Telehealth: Payer: Self-pay | Admitting: Gastroenterology

## 2016-06-28 ENCOUNTER — Other Ambulatory Visit (INDEPENDENT_AMBULATORY_CARE_PROVIDER_SITE_OTHER): Payer: Managed Care, Other (non HMO)

## 2016-06-28 ENCOUNTER — Telehealth: Payer: Self-pay

## 2016-06-28 DIAGNOSIS — K2970 Gastritis, unspecified, without bleeding: Secondary | ICD-10-CM | POA: Diagnosis not present

## 2016-06-28 DIAGNOSIS — K209 Esophagitis, unspecified without bleeding: Secondary | ICD-10-CM

## 2016-06-28 DIAGNOSIS — R1013 Epigastric pain: Secondary | ICD-10-CM

## 2016-06-28 DIAGNOSIS — K2980 Duodenitis without bleeding: Secondary | ICD-10-CM

## 2016-06-28 DIAGNOSIS — K2990 Gastroduodenitis, unspecified, without bleeding: Secondary | ICD-10-CM

## 2016-06-28 LAB — HELICOBACTER PYLORI SCREEN-BIOPSY: UREASE: POSITIVE — AB

## 2016-06-28 NOTE — Telephone Encounter (Signed)
No answer, name identifier Sandy Ballard), left message to call if questions or concerns and that we would attempt to call later in the day.

## 2016-06-28 NOTE — Telephone Encounter (Signed)
  Follow up Call-  Call back number 06/27/2016  Post procedure Call Back phone  # [phone]  Permission to leave phone message Yes  Some recent data might be hidden     Patient questions:  Do you have a fever, pain , or abdominal swelling? No. Pain Score  0 *  Have you tolerated food without any problems? Yes.    Have you been able to return to your normal activities? Yes.    Do you have any questions about your discharge instructions: Diet   No. Medications  No. Follow up visit  No.  Do you have questions or concerns about your Care? No.  Actions: * If pain score is 4 or above: No action needed, pain <4.

## 2016-06-29 MED ORDER — BIS SUBCIT-METRONID-TETRACYC 140-125-125 MG PO CAPS: 3.0000 | ORAL_CAPSULE | Freq: Three times a day (TID) | ORAL | 0 refills | Status: DC

## 2016-06-29 NOTE — Telephone Encounter (Signed)
Left message for patient to call back Also see results notes

## 2016-06-29 NOTE — Telephone Encounter (Signed)
Unlikely to be related to propofol, could be contact dermatitis.

## 2016-06-29 NOTE — Telephone Encounter (Signed)
See pathology report for additional details as well

## 2016-06-29 NOTE — Telephone Encounter (Signed)
Patient notified of the results of the biopsy and she is notified I will call her with the alternative antibiotic to Pylera once Dr. Silverio Decamp answers.  She has an allergy to tetracycline.  She has not taken it since she was 18 and does not remember the reaction.   She is provided the number to [facility]  She also reports that she had a systemic rash after her procedure.  She took benadryl and it is now resolved.  She is questioning if she has an allergy to the latex band aid that was applied to her after an accucheck or an allergy to Propofol.

## 2016-07-02 ENCOUNTER — Encounter: Payer: Self-pay | Admitting: Gastroenterology

## 2016-07-02 ENCOUNTER — Telehealth: Payer: Self-pay

## 2016-07-02 NOTE — Telephone Encounter (Signed)
Ok to switch to [employer] for 14 days course along with Bismuth tablets 262mg  four times daily. Thanks

## 2016-07-02 NOTE — Telephone Encounter (Signed)
Dr Silverio Decamp, Pylera not covered for this patient..... Lansoprazole/Amox/Clarithro Pak covered   How do you want to prescribe this

## 2016-07-02 NOTE — Telephone Encounter (Signed)
Called patients pharmacy and sent in new prescription that will be covered under patients insurance and also called in the bismuth tablets    Called patient and informed her that new prescription was sent in to pahrmacy

## 2016-07-04 ENCOUNTER — Telehealth: Payer: Self-pay | Admitting: Gastroenterology

## 2016-07-04 NOTE — Telephone Encounter (Signed)
Patient returning Beth's call

## 2016-07-04 NOTE — Telephone Encounter (Signed)
Left message to call back  

## 2016-07-04 NOTE — Telephone Encounter (Signed)
Though the risk is low, can sometimes cause hypotension. She should follow the advise and check BP regulary, if she is unable to do, may consider discussing with PMD if OK to hold amlodipine while she is taking the antibiotics for H.pylori therapy. Her Insurance doesn't cover Pylera and if she is still worried can consider switching the course.

## 2016-07-04 NOTE — Telephone Encounter (Signed)
Patient is scared of the Pre pack due to a potential interaction between her amlodipine and the Biaxin. Pharmacist says the BP should be monitored but the patient is afraid. Advise?

## 2016-07-04 NOTE — Telephone Encounter (Signed)
Patient is instructed. She will go forward with the treatment plan.

## 2016-07-06 ENCOUNTER — Telehealth: Payer: Self-pay | Admitting: Gastroenterology

## 2016-07-06 NOTE — Telephone Encounter (Signed)
Patient states her blood sugar has run close to 200. She asks if the atb could cause this. I have encouraged her to contact her provider that manages her diabetes.

## 2016-07-24 ENCOUNTER — Encounter: Payer: Managed Care, Other (non HMO) | Admitting: Gastroenterology

## 2017-08-26 ENCOUNTER — Other Ambulatory Visit: Payer: Self-pay | Admitting: Gastroenterology

## 2017-10-28 ENCOUNTER — Other Ambulatory Visit: Payer: Self-pay | Admitting: Gastroenterology

## 2019-01-08 ENCOUNTER — Other Ambulatory Visit: Payer: Self-pay | Admitting: Gastroenterology

## 2021-06-27 ENCOUNTER — Encounter: Payer: Self-pay | Admitting: Gastroenterology

## 2021-07-06 ENCOUNTER — Telehealth: Payer: Self-pay

## 2021-07-06 NOTE — Telephone Encounter (Signed)
NOTES SCANNED TO REFERRAL 

## 2021-07-16 NOTE — Progress Notes (Unsigned)
Cardiology Office Note   Date:  07/16/2021   ID:  Sandy Ballard, DOB Jan 04, 1956, MRN [id number]  PCP:  Olena Mater, MD  Cardiologist:   Gloria Ricardo Martinique, MD   No chief complaint on file.     History of Present Illness: Sandy Ballard is a 66 y.o. female who is seen at the request of Dr Ginette Otto for evaluation of exertional dyspnea. She has a history of HTN.     Past Medical History:  Diagnosis Date   Adenomatous colon polyp    Anxiety    Biliary dyskinesia    Duodenal ulcer    Duodenal ulcer    Fibromyalgia    GERD (gastroesophageal reflux disease)    H. pylori infection    Hiatal hernia    Hyperplastic colon polyp    Hypertension    IBS (irritable bowel syndrome)    Migraine     Past Surgical History:  Procedure Laterality Date   ABDOMINAL HYSTERECTOMY     CHOLECYSTECTOMY     LAPAROSCOPIC ABDOMINAL EXPLORATION     THYROID SURGERY     TONSILLECTOMY       Current Outpatient Medications  Medication Sig Dispense Refill   albuterol (PROVENTIL HFA;VENTOLIN HFA) 108 (90 Base) MCG/ACT inhaler Inhale 1 puff into the lungs every 4 (four) hours as needed.     albuterol (PROVENTIL) (2.5 MG/3ML) 0.083% nebulizer solution Inhale 2.5 mg into the lungs every 6 (six) hours as needed.     ALPRAZolam (XANAX) 0.5 MG tablet Take 1 tablet by mouth 4 (four) times daily as needed.     amLODipine (NORVASC) 5 MG tablet Take 1 tablet by mouth daily.     aspirin 81 MG chewable tablet Chew 1 tablet by mouth daily.     bismuth-metronidazole-tetracycline (PYLERA) 140-125-125 MG capsule Take 3 capsules by mouth 4 (four) times daily -  before meals and at bedtime. 120 capsule 0   budesonide-formoterol (SYMBICORT) 160-4.5 MCG/ACT inhaler Inhale 2 puffs into the lungs 2 (two) times daily.     carvedilol (COREG) 25 MG tablet Take 1 tablet by mouth 2 (two) times daily.     cyclobenzaprine (FLEXERIL) 10 MG tablet Take 1 tablet by mouth 3 (three) times daily.     esomeprazole (NEXIUM) 40 MG  capsule Take 1 capsule (40 mg total) by mouth daily at 12 noon. 30 minutes before breakfast 30 capsule 6   folic acid (FOLVITE) 1 MG tablet Take 1 tablet by mouth daily.     gabapentin (NEURONTIN) 400 MG capsule Take 1 capsule by mouth 3 (three) times daily.     hydrochlorothiazide (HYDRODIURIL) 25 MG tablet Take 1 tablet by mouth daily.     hyoscyamine (LEVSIN, ANASPAZ) 0.125 MG tablet TAKE ONE TABLET BY MOUTH EVERY 6 HOURS AS NEEDED 30 tablet 2   levothyroxine (SYNTHROID, LEVOTHROID) 50 MCG tablet Take 1 tablet by mouth daily.     metFORMIN (GLUCOPHAGE) 500 MG tablet Take 1 tablet by mouth 2 (two) times daily.     potassium chloride SA (K-DUR,KLOR-CON) 20 MEQ tablet Take 1 tablet by mouth daily.     promethazine (PHENERGAN) 25 MG tablet Take 0.5 tablets (12.5 mg total) by mouth every 6 (six) hours as needed for nausea or vomiting. (Patient not taking: Reported on 06/27/2016) 15 tablet 0   promethazine (PHENERGAN) 25 MG tablet Take 1 tablet by mouth every 6 (six) hours as needed.     ranitidine (ZANTAC) 300 MG tablet TAKE ONE TABLET BY MOUTH EVERY  NIGHT AT BEDTIME 30 tablet 5   sucralfate (CARAFATE) 1 g tablet Take 2 tablets by mouth 2 (two) times daily.     traZODone (DESYREL) 50 MG tablet Take 50 mg by mouth at bedtime.     Vitamin D, Ergocalciferol, (DRISDOL) 50000 units CAPS capsule Take 1 capsule by mouth once a week.     Current Facility-Administered Medications  Medication Dose Route Frequency Provider Last Rate Last Admin   0.9 %  sodium chloride infusion  500 mL Intravenous Continuous Nandigam, Kavitha V, MD        Allergies:   Lisinopril, Ciprofloxacin, Erythromycin, and Tetracycline    Social History:  The patient  reports that she quit smoking about 7 years ago. She has never used smokeless tobacco. She reports that she does not drink alcohol and does not use drugs.   Family History:  The patient's ***family history includes Breast cancer in her mother; Diabetes in her mother.     ROS:  Please see the history of present illness.   Otherwise, review of systems are positive for {NONE DEFAULTED:18576}.   All other systems are reviewed and negative.    PHYSICAL EXAM: VS:  There were no vitals taken for this visit. , BMI There is no height or weight on file to calculate BMI. GEN: Well nourished, well developed, in no acute distress HEENT: normal Neck: no JVD, carotid bruits, or masses Cardiac: ***RRR; no murmurs, rubs, or gallops,no edema  Respiratory:  clear to auscultation bilaterally, normal work of breathing GI: soft, nontender, nondistended, + BS MS: no deformity or atrophy Skin: warm and dry, no rash Neuro:  Strength and sensation are intact Psych: euthymic mood, full affect   EKG:  EKG {ACTION; IS/IS BJY:[id number]} ordered today. The ekg ordered today demonstrates ***   Recent Labs: No results found for requested labs within last 8760 hours.    Lipid Panel No results found for: CHOL, TRIG, HDL, CHOLHDL, VLDL, LDLCALC, LDLDIRECT    Wt Readings from Last 3 Encounters:  06/27/16 232 lb (105.2 kg)  06/08/16 232 lb (105.2 kg)      Other studies Reviewed: Additional studies/ records that were reviewed today include: ***. Review of the above records demonstrates: ***   ASSESSMENT AND PLAN:  1.  ***   Current medicines are reviewed at length with the patient today.  The patient {ACTIONS; HAS/DOES NOT HAVE:19233} concerns regarding medicines.  The following changes have been made:  {PLAN; NO CHANGE:13088:s}  Labs/ tests ordered today include: *** No orders of the defined types were placed in this encounter.        Disposition:   FU with *** in {gen number 3-08:657846} {Days to years:10300}  Signed, Ameri Cahoon Martinique, MD  07/16/2021 9:33 AM    [facility] [address] [place], [place], [address] Phone [phone], Fax [phone]

## 2021-07-21 ENCOUNTER — Ambulatory Visit: Payer: Managed Care, Other (non HMO) | Admitting: Cardiology

## 2021-07-28 ENCOUNTER — Ambulatory Visit: Payer: Managed Care, Other (non HMO) | Admitting: Gastroenterology

## 2021-08-14 ENCOUNTER — Ambulatory Visit: Payer: Managed Care, Other (non HMO) | Admitting: Gastroenterology

## 2021-08-18 NOTE — Progress Notes (Deleted)
?  ?Cardiology Office Note ? ? ?Date:  08/18/2021  ? ?ID:  Sandy Ballard, DOB 06/29/1955, MRN [id number] ? ?PCP:  Olena Mater, MD  ?Cardiologist:   Patina Spanier Martinique, MD  ? ?No chief complaint on file. ? ? ?  ?History of Present Illness: ?Sandy Ballard is a 67 y.o. female who is seen at the request of Dr Ginette Otto for evaluation of dyspnea. She has a history of HTN and PUD. Prior cardiac evaluation in 2015 at [facility] including Echo and Myoview were unremarkable.  ? ? ? ?Past Medical History:  ?Diagnosis Date  ? Adenomatous colon polyp   ? Anxiety   ? Biliary dyskinesia   ? Duodenal ulcer   ? Duodenal ulcer   ? Fibromyalgia   ? GERD (gastroesophageal reflux disease)   ? H. pylori infection   ? Hiatal hernia   ? Hyperplastic colon polyp   ? Hypertension   ? IBS (irritable bowel syndrome)   ? Migraine   ? ? ?Past Surgical History:  ?Procedure Laterality Date  ? ABDOMINAL HYSTERECTOMY    ? CHOLECYSTECTOMY    ? LAPAROSCOPIC ABDOMINAL EXPLORATION    ? THYROID SURGERY    ? TONSILLECTOMY    ? ? ? ?Current Outpatient Medications  ?Medication Sig Dispense Refill  ? albuterol (PROVENTIL HFA;VENTOLIN HFA) 108 (90 Base) MCG/ACT inhaler Inhale 1 puff into the lungs every 4 (four) hours as needed.    ? albuterol (PROVENTIL) (2.5 MG/3ML) 0.083% nebulizer solution Inhale 2.5 mg into the lungs every 6 (six) hours as needed.    ? ALPRAZolam (XANAX) 0.5 MG tablet Take 1 tablet by mouth 4 (four) times daily as needed.    ? amLODipine (NORVASC) 5 MG tablet Take 1 tablet by mouth daily.    ? aspirin 81 MG chewable tablet Chew 1 tablet by mouth daily.    ? bismuth-metronidazole-tetracycline (PYLERA) 140-125-125 MG capsule Take 3 capsules by mouth 4 (four) times daily -  before meals and at bedtime. 120 capsule 0  ? budesonide-formoterol (SYMBICORT) 160-4.5 MCG/ACT inhaler Inhale 2 puffs into the lungs 2 (two) times daily.    ? carvedilol (COREG) 25 MG tablet Take 1 tablet by mouth 2 (two) times daily.    ? cyclobenzaprine (FLEXERIL) 10  MG tablet Take 1 tablet by mouth 3 (three) times daily.    ? esomeprazole (NEXIUM) 40 MG capsule Take 1 capsule (40 mg total) by mouth daily at 12 noon. 30 minutes before breakfast 30 capsule 6  ? folic acid (FOLVITE) 1 MG tablet Take 1 tablet by mouth daily.    ? gabapentin (NEURONTIN) 400 MG capsule Take 1 capsule by mouth 3 (three) times daily.    ? hydrochlorothiazide (HYDRODIURIL) 25 MG tablet Take 1 tablet by mouth daily.    ? hyoscyamine (LEVSIN, ANASPAZ) 0.125 MG tablet TAKE ONE TABLET BY MOUTH EVERY 6 HOURS AS NEEDED 30 tablet 2  ? levothyroxine (SYNTHROID, LEVOTHROID) 50 MCG tablet Take 1 tablet by mouth daily.    ? metFORMIN (GLUCOPHAGE) 500 MG tablet Take 1 tablet by mouth 2 (two) times daily.    ? potassium chloride SA (K-DUR,KLOR-CON) 20 MEQ tablet Take 1 tablet by mouth daily.    ? promethazine (PHENERGAN) 25 MG tablet Take 0.5 tablets (12.5 mg total) by mouth every 6 (six) hours as needed for nausea or vomiting. (Patient not taking: Reported on 06/27/2016) 15 tablet 0  ? promethazine (PHENERGAN) 25 MG tablet Take 1 tablet by mouth every 6 (six) hours as needed.    ?  ranitidine (ZANTAC) 300 MG tablet TAKE ONE TABLET BY MOUTH EVERY NIGHT AT BEDTIME 30 tablet 5  ? sucralfate (CARAFATE) 1 g tablet Take 2 tablets by mouth 2 (two) times daily.    ? traZODone (DESYREL) 50 MG tablet Take 50 mg by mouth at bedtime.    ? Vitamin D, Ergocalciferol, (DRISDOL) 50000 units CAPS capsule Take 1 capsule by mouth once a week.    ? ?Current Facility-Administered Medications  ?Medication Dose Route Frequency Provider Last Rate Last Admin  ? 0.9 %  sodium chloride infusion  500 mL Intravenous Continuous Nandigam, Venia Minks, MD      ? ? ?Allergies:   Lisinopril, Ciprofloxacin, Erythromycin, and Tetracycline  ? ? ?Social History:  The patient  reports that she quit smoking about 7 years ago. She has never used smokeless tobacco. She reports that she does not drink alcohol and does not use drugs.  ? ?Family History:  The  patient's ***family history includes Breast cancer in her mother; Diabetes in her mother.  ? ? ?ROS:  Please see the history of present illness.   Otherwise, review of systems are positive for {NONE DEFAULTED:18576}.   All other systems are reviewed and negative.  ? ? ?PHYSICAL EXAM: ?VS:  There were no vitals taken for this visit. , BMI There is no height or weight on file to calculate BMI. ?GEN: Well nourished, well developed, in no acute distress ?HEENT: normal ?Neck: no JVD, carotid bruits, or masses ?Cardiac: ***RRR; no murmurs, rubs, or gallops,no edema  ?Respiratory:  clear to auscultation bilaterally, normal work of breathing ?GI: soft, nontender, nondistended, + BS ?MS: no deformity or atrophy ?Skin: warm and dry, no rash ?Neuro:  Strength and sensation are intact ?Psych: euthymic mood, full affect ? ? ?EKG:  EKG {ACTION; IS/IS WUX:[id number]} ordered today. ?The ekg ordered today demonstrates *** ? ? ?Recent Labs: ?No results found for requested labs within last 8760 hours.  ? ? ?Lipid Panel ?No results found for: CHOL, TRIG, HDL, CHOLHDL, VLDL, LDLCALC, LDLDIRECT ?  ?Dated 09/30/20: cholesterol 220, triglycerides 116, HDL 62, LDL 135.  ?Dated 07/19/21: AST 74, ALT 73. Potassium 3.1. sodium 132. Creatinine 1.76. glucose 252. CBC normal ? ?Wt Readings from Last 3 Encounters:  ?06/27/16 232 lb (105.2 kg)  ?06/08/16 232 lb (105.2 kg)  ?  ? ? ?Other studies Reviewed: ?Additional studies/ records that were reviewed today include:  ? ?Echo: 03/11/14: Summary  ? 1. Overall left ventricular ejection fraction is estimated at 60 to 65%.  ? 2. Normal global left ventricular systolic function.  ? 3. (Grade 1) Mildly abnormal left ventricular diastolic filling.  ? 4. Moderate concentric left ventricular hypertrophy.  ? 5. Mild mitral annular calcification.  ? 6. Elevated left ventricular end diastolic pressure by tissue Doppler.  ? 7. No intracardiac thrombi, mass or vegetations.   ? ?Myoview 03/11/14: EKG Findings: REST:  Normal sinus rhythm. Non specific ST-T wave  ?changes.  ?  STRESS: Non specific ST-T wave changes, non diagnostic.  ? ?IMPRESSION:  ?Normal study.  ?Inferoseptal hot spot; consider left ventricular hypertrophy.  ?Visually EF > 65%.( calculated at 50%).  ? ?Interpreted and approved by: Dr. Oswaldo Done   Date:  ?03-11-2014 ? ? ?ASSESSMENT AND PLAN: ? ?1.  *** ? ? ?Current medicines are reviewed at length with the patient today.  The patient {ACTIONS; HAS/DOES NOT HAVE:19233} concerns regarding medicines. ? ?The following changes have been made:  {PLAN; NO CHANGE:13088:s} ? ?Labs/ tests ordered today include: *** ?  No orders of the defined types were placed in this encounter. ? ? ?   ? ? ?Disposition:   FU with *** in {gen number 2-70:623762} {Days to years:10300} ? ?Signed, ?Phyllis Abelson Martinique, MD  ?08/18/2021 7:44 AM    ?[facility] ?[address] [place], [place], [address] ?Phone [phone], Fax [phone] ? ? ?

## 2021-08-23 ENCOUNTER — Ambulatory Visit: Payer: Managed Care, Other (non HMO) | Admitting: Cardiology

## 2021-09-05 ENCOUNTER — Encounter: Payer: Self-pay | Admitting: Gastroenterology

## 2021-09-11 ENCOUNTER — Encounter: Payer: Self-pay | Admitting: Gastroenterology

## 2021-09-11 ENCOUNTER — Ambulatory Visit: Payer: Medicare HMO | Admitting: Gastroenterology

## 2021-09-11 VITALS — BP 126/82 | HR 89 | Ht 61.0 in | Wt 230.0 lb

## 2021-09-11 DIAGNOSIS — K589 Irritable bowel syndrome without diarrhea: Secondary | ICD-10-CM | POA: Diagnosis not present

## 2021-09-11 DIAGNOSIS — R1310 Dysphagia, unspecified: Secondary | ICD-10-CM

## 2021-09-11 DIAGNOSIS — R1084 Generalized abdominal pain: Secondary | ICD-10-CM

## 2021-09-11 DIAGNOSIS — K649 Unspecified hemorrhoids: Secondary | ICD-10-CM

## 2021-09-11 MED ORDER — FAMOTIDINE 20 MG PO TABS: 20.0000 mg | ORAL_TABLET | Freq: Every day | ORAL | 3 refills | Status: DC

## 2021-09-11 MED ORDER — PRAMOXINE-HC 1-1 % EX CREA: TOPICAL_CREAM | Freq: Three times a day (TID) | CUTANEOUS | 1 refills | Status: DC

## 2021-09-11 MED ORDER — ESOMEPRAZOLE MAGNESIUM 40 MG PO CPDR: 40.0000 mg | DELAYED_RELEASE_CAPSULE | Freq: Two times a day (BID) | ORAL | 3 refills | Status: DC

## 2021-09-11 MED ORDER — SUCRALFATE 1 G PO TABS: 1.0000 g | ORAL_TABLET | Freq: Two times a day (BID) | ORAL | 3 refills | Status: AC

## 2021-09-11 NOTE — Progress Notes (Signed)
? ?       ? ?Sandy Ballard    [id number]    09-27-1955 ? ?Primary Care Physician:Zimmer, Gwyndolyn Saxon, MD ? ?Referring Physician: Olena Mater, MD ?[address] ?[place] [address] ? ? ?Chief complaint:  Abd pain, dysphagia, bloating, IBS, diarrhea ? ?HPI: ? ?66 year old very pleasant female with history of chronic GERD and IBS is here to reestablish care.  Patient was previously seen in 2018. ?Patient complains of breakthrough heartburn and intermittent dysphagia.  She has intermittent choking sensation, does not feel the food goes down. ?She is not taking PPI.  She is using Carafate with some improvement.  Complains of severe abdominal bloating associated with abdominal discomfort and irregular bowel habits with intermittent diarrhea. ? ?EGD June 27, 2016 showed LA grade C esophagitis, gastritis and duodenitis ?Colonoscopy June 27, 2016 with removal of 2 small sessile polyps and hemorrhoids.  1 TA and 1 hyperplastic polyp ? ?Outpatient Encounter Medications as of 09/11/2021  ?Medication Sig  ? albuterol (PROVENTIL HFA;VENTOLIN HFA) 108 (90 Base) MCG/ACT inhaler Inhale 1 puff into the lungs every 4 (four) hours as needed.  ? albuterol (PROVENTIL) (2.5 MG/3ML) 0.083% nebulizer solution Inhale 2.5 mg into the lungs every 6 (six) hours as needed.  ? amLODipine (NORVASC) 5 MG tablet Take 1 tablet by mouth daily.  ? aspirin 81 MG chewable tablet Chew 1 tablet by mouth daily.  ? budesonide-formoterol (SYMBICORT) 160-4.5 MCG/ACT inhaler Inhale 2 puffs into the lungs 2 (two) times daily.  ? carvedilol (COREG) 25 MG tablet Take 1 tablet by mouth 2 (two) times daily.  ? cyclobenzaprine (FLEXERIL) 10 MG tablet Take 1 tablet by mouth 3 (three) times daily.  ? folic acid (FOLVITE) 1 MG tablet Take 1 tablet by mouth daily.  ? gabapentin (NEURONTIN) 400 MG capsule Take 1 capsule by mouth 3 (three) times daily.  ? hydrochlorothiazide (HYDRODIURIL) 25 MG tablet Take 1 tablet by mouth daily.  ? hyoscyamine  (LEVSIN, ANASPAZ) 0.125 MG tablet TAKE ONE TABLET BY MOUTH EVERY 6 HOURS AS NEEDED  ? levothyroxine (SYNTHROID, LEVOTHROID) 50 MCG tablet Take 1 tablet by mouth daily.  ? metFORMIN (GLUCOPHAGE) 500 MG tablet Take 1 tablet by mouth 2 (two) times daily.  ? potassium chloride SA (K-DUR,KLOR-CON) 20 MEQ tablet Take 1 tablet by mouth daily.  ? promethazine (PHENERGAN) 25 MG tablet Take 1 tablet by mouth every 6 (six) hours as needed.  ? sucralfate (CARAFATE) 1 g tablet Take 2 tablets by mouth 2 (two) times daily.  ? Vitamin D, Ergocalciferol, (DRISDOL) 50000 units CAPS capsule Take 1 capsule by mouth once a week.  ? bismuth-metronidazole-tetracycline (PYLERA) 140-125-125 MG capsule Take 3 capsules by mouth 4 (four) times daily -  before meals and at bedtime. (Patient not taking: Reported on 09/11/2021)  ? esomeprazole (NEXIUM) 40 MG capsule Take 1 capsule (40 mg total) by mouth daily at 12 noon. 30 minutes before breakfast (Patient not taking: Reported on 09/11/2021)  ? promethazine (PHENERGAN) 25 MG tablet Take 0.5 tablets (12.5 mg total) by mouth every 6 (six) hours as needed for nausea or vomiting. (Patient not taking: Reported on 06/27/2016)  ? [DISCONTINUED] ALPRAZolam (XANAX) 0.5 MG tablet Take 1 tablet by mouth 4 (four) times daily as needed. (Patient not taking: Reported on 09/11/2021)  ? [DISCONTINUED] ranitidine (ZANTAC) 300 MG tablet TAKE ONE TABLET BY MOUTH EVERY NIGHT AT BEDTIME (Patient not taking: Reported on 09/11/2021)  ? [DISCONTINUED] traZODone (DESYREL) 50 MG tablet Take 50 mg by mouth at bedtime. (  Patient not taking: Reported on 09/11/2021)  ? ?Facility-Administered Encounter Medications as of 09/11/2021  ?Medication  ? 0.9 %  sodium chloride infusion  ? ? ?Allergies as of 09/11/2021 - Review Complete 09/11/2021  ?Allergen Reaction Noted  ? Lisinopril Anaphylaxis 06/08/2016  ? Ciprofloxacin Swelling 03/30/2010  ? Erythromycin  11/21/2009  ? Tetracycline  11/21/2009  ? ? ?Past Medical History:  ?Diagnosis  Date  ? Adenomatous colon polyp   ? Anxiety   ? Arthritis   ? Biliary dyskinesia   ? Diabetes ([place])   ? Duodenal ulcer   ? Duodenal ulcer   ? Fibromyalgia   ? GERD (gastroesophageal reflux disease)   ? H. pylori infection   ? Hiatal hernia   ? Hyperplastic colon polyp   ? Hypertension   ? IBS (irritable bowel syndrome)   ? Migraine   ? Thyroid disease   ? ? ?Past Surgical History:  ?Procedure Laterality Date  ? ABDOMINAL HYSTERECTOMY    ? CHOLECYSTECTOMY    ? LAPAROSCOPIC ABDOMINAL EXPLORATION    ? THYROID SURGERY    ? TONSILLECTOMY    ? ? ?Family History  ?Problem Relation Age of Onset  ? Breast cancer Mother   ? Diabetes Mother   ? Colon cancer Neg Hx   ? Stomach cancer Neg Hx   ? Esophageal cancer Neg Hx   ? ? ?Social History  ? ?Socioeconomic History  ? Marital status: Married  ?  Spouse name: Not on file  ? Number of children: 1  ? Years of education: Not on file  ? Highest education level: Not on file  ?Occupational History  ? Occupation: Disabled  ?Tobacco Use  ? Smoking status: Former  ?  Types: Cigarettes  ?  Quit date: 01/04/2014  ?  Years since quitting: 7.6  ? Smokeless tobacco: Never  ?Vaping Use  ? Vaping Use: Not on file  ?Substance and Sexual Activity  ? Alcohol use: No  ? Drug use: No  ? Sexual activity: Not on file  ?Other Topics Concern  ? Not on file  ?Social History Narrative  ? Not on file  ? ?Social Determinants of Health  ? ?Financial Resource Strain: Not on file  ?Food Insecurity: Not on file  ?Transportation Needs: Not on file  ?Physical Activity: Not on file  ?Stress: Not on file  ?Social Connections: Not on file  ?Intimate Partner Violence: Not on file  ? ? ? ? ?Review of systems: ?All other review of systems negative except as mentioned in the HPI. ? ? ?Physical Exam: ?Vitals:  ? 09/11/21 0840  ?BP: 126/82  ?Pulse: 89  ?SpO2: 97%  ? ?Body mass index is 43.46 kg/m?. ?Gen:      No acute distress ?HEENT:  sclera anicteric ?Abd:      soft, non-tender; no palpable masses, no distension ?Ext:     No edema ?Neuro: alert and oriented x 3 ?Psych: normal mood and affect ? ?Data Reviewed: ? ?Reviewed labs, radiology imaging, old records and pertinent past GI work up ? ? ?Assessment and Plan/Recommendations: ? ?66 year old very pleasant female with history of obesity and longstanding GERD with erosive esophagitis with complaints of worsening dysphagia and breakthrough heartburn ? ?Schedule for EGD for further evaluation to exclude peptic stricture, worsening erosive esophagitis ?The risks and benefits as well as alternatives of endoscopic procedure(s) have been discussed and reviewed. All questions answered. The patient agrees to proceed. ? ? ?GERD: Use Nexium 40 mg twice daily and Pepcid at bedtime  as needed ?If continues to have persistent breakthrough, use Carafate up to 3 times daily as needed ? ?Patient is interested in gastric bypass surgery, we will send referral to bariatric surgery Dr. Redmond Pulling for further evaluation ? ?Since she may benefit from GLP-1 agonist for weight loss and management of diabetes, advised patient to discuss with her PMD ? 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 ?Symptomatic hemorrhoids: Use Anal pram and small pea-sized amount per rectum twice daily as needed ? ?Return in 3 months or sooner if needed ? ?The patient was provided an opportunity to ask questions and all were answered. The patient agreed with the plan and demonstrated an understanding of the instructions. ? ?K. Denzil Magnuson , MD ?  ? ?CC: Olena Mater, MD ? ? ?

## 2021-09-11 NOTE — Patient Instructions (Addendum)
You have been scheduled for an endoscopy. Please follow written instructions given to you at your visit today. ?If you use inhalers (even only as needed), please bring them with you on the day of your procedure.  ? ?NO Oral diabetic meds the morning of procedure ? ?We have put in a referral for you with [facility] to see Jefm Petty ?They will contact you to set your appointment up  ? ?We have sent the following medications to your pharmacy for you to pick up at your convenience:   Carafate Pepcid  Carafate Anal pram Nexium  ? ?Due to recent changes in healthcare laws, you may see the results of your imaging and laboratory studies on MyChart before your provider has had a chance to review them.  We understand that in some cases there may be results that are confusing or concerning to you. Not all laboratory results come back in the same time frame and the provider may be waiting for multiple results in order to interpret others.  Please give [place] 48 hours in order for your provider to thoroughly review all the results before contacting the office for clarification of your results.   ? ?If you are age 19 or older, your body mass index should be between 23-30. Your Body mass index is 43.46 kg/m?Marland Kitchen If this is out of the aforementioned range listed, please consider follow up with your Primary Care Provider. ? ?If you are age 62 or younger, your body mass index should be between 19-25. Your Body mass index is 43.46 kg/m?Marland Kitchen If this is out of the aformentioned range listed, please consider follow up with your Primary Care Provider.  ? ?________________________________________________________ ? ?The [facility] providers would like to encourage you to use [facility] to communicate with providers for non-urgent requests or questions.  Due to long hold times on the telephone, sending your provider a message by [facility] may be a faster and more efficient way to get a response.  Please allow 48 business hours for a  response.  Please remember that this is for non-urgent requests.  ?_______________________________________________________  ? ?I appreciate the  opportunity to care for you ? ?Thank You  ? ?Harl Bowie , MD  ? ?Gastroesophageal Reflux Disease, Adult ?Gastroesophageal reflux (GER) happens when acid from the stomach flows up into the tube that connects the mouth and the stomach (esophagus). Normally, food travels down the esophagus and stays in the stomach to be digested. However, when a person has GER, food and stomach acid sometimes move back up into the esophagus. If this becomes a more serious problem, the person may be diagnosed with a disease called gastroesophageal reflux disease (GERD). GERD occurs when the reflux: ?Happens often. ?Causes frequent or severe symptoms. ?Causes problems such as damage to the esophagus. ?When stomach acid comes in contact with the esophagus, the acid may cause inflammation in the esophagus. Over time, GERD may create small holes (ulcers) in the lining of the esophagus. ?What are the causes? ?This condition is caused by a problem with the muscle between the esophagus and the stomach (lower esophageal sphincter, or LES). Normally, the LES muscle closes after food passes through the esophagus to the stomach. When the LES is weakened or abnormal, it does not close properly, and that allows food and stomach acid to go back up into the esophagus. ?The LES can be weakened by certain dietary substances, medicines, and medical conditions, including: ?Tobacco use. ?Pregnancy. ?Having a hiatal hernia. ?Alcohol use. ?Certain foods  and beverages, such as coffee, chocolate, onions, and peppermint. ?What increases the risk? ?You are more likely to develop this condition if you: ?Have an increased body weight. ?Have a connective tissue disorder. ?Take NSAIDs, such as ibuprofen. ?What are the signs or symptoms? ?Symptoms of this condition include: ?Heartburn. ?Difficult or painful swallowing  and the feeling of having a lump in the throat. ?A bitter taste in the mouth. ?Bad breath and having a large amount of saliva. ?Having an upset or bloated stomach and belching. ?Chest pain. Different conditions can cause chest pain. Make sure you see your health care provider if you experience chest pain. ?Shortness of breath or wheezing. ?Ongoing (chronic) cough or a nighttime cough. ?Wearing away of tooth enamel. ?Weight loss. ?How is this diagnosed? ?This condition may be diagnosed based on a medical history and a physical exam. To determine if you have mild or severe GERD, your health care provider may also monitor how you respond to treatment. You may also have tests, including: ?A test to examine your stomach and esophagus with a small camera (endoscopy). ?A test that measures the acidity level in your esophagus. ?A test that measures how much pressure is on your esophagus. ?A barium swallow or modified barium swallow test to show the shape, size, and functioning of your esophagus. ?How is this treated? ?Treatment for this condition may vary depending on how severe your symptoms are. Your health care provider may recommend: ?Changes to your diet. ?Medicine. ?Surgery. ?The goal of treatment is to help relieve your symptoms and to prevent complications. ?Follow these instructions at home: ?Eating and drinking ? ?Follow a diet as recommended by your health care provider. This may involve avoiding foods and drinks such as: ?Coffee and tea, with or without caffeine. ?Drinks that contain alcohol. ?Energy drinks and sports drinks. ?Carbonated drinks or sodas. ?Chocolate and cocoa. ?Peppermint and mint flavorings. ?Garlic and onions. ?Horseradish. ?Spicy and acidic foods, including peppers, chili powder, curry powder, vinegar, hot sauces, and barbecue sauce. ?Citrus fruit juices and citrus fruits, such as oranges, lemons, and limes. ?Tomato-based foods, such as red sauce, chili, salsa, and pizza with red sauce. ?Fried  and fatty foods, such as donuts, french fries, potato chips, and high-fat dressings. ?High-fat meats, such as hot dogs and fatty cuts of red and white meats, such as rib eye steak, sausage, ham, and bacon. ?High-fat dairy items, such as whole milk, butter, and cream cheese. ?Eat small, frequent meals instead of large meals. ?Avoid drinking large amounts of liquid with your meals. ?Avoid eating meals during the 2-3 hours before bedtime. ?Avoid lying down right after you eat. ?Do not exercise right after you eat. ?Lifestyle ? ?Do not use any products that contain nicotine or tobacco. These products include cigarettes, chewing tobacco, and vaping devices, such as e-cigarettes. If you need help quitting, ask your health care provider. ?Try to reduce your stress by using methods such as yoga or meditation. If you need help reducing stress, ask your health care provider. ?If you are overweight, reduce your weight to an amount that is healthy for you. Ask your health care provider for guidance about a safe weight loss goal. ?General instructions ?Pay attention to any changes in your symptoms. ?Take over-the-counter and prescription medicines only as told by your health care provider. Do not take aspirin, ibuprofen, or other NSAIDs unless your health care provider told you to take these medicines. ?Wear loose-fitting clothing. Do not wear anything tight around your waist that causes pressure on  your abdomen. ?Raise (elevate) the head of your bed about 6 inches (15 cm). You can use a wedge to do this. ?Avoid bending over if this makes your symptoms worse. ?Keep all follow-up visits. This is important. ?Contact a health care provider if: ?You have: ?New symptoms. ?Unexplained weight loss. ?Difficulty swallowing or it hurts to swallow. ?Wheezing or a persistent cough. ?A hoarse voice. ?Your symptoms do not improve with treatment. ?Get help right away if: ?You have sudden pain in your arms, neck, jaw, teeth, or back. ?You  suddenly feel sweaty, dizzy, or light-headed. ?You have chest pain or shortness of breath. ?You vomit and the vomit is green, yellow, or black, or it looks like blood or coffee grounds. ?You faint. ?You have

## 2021-09-15 NOTE — Progress Notes (Signed)
?  ?Cardiology Office Note ? ? ?Date:  09/20/2021  ? ?ID:  Sandy Ballard, DOB Sep 09, 1955, MRN [id number] ? ?PCP:  Olena Mater, MD  ?Cardiologist:   Sundeep Cary Martinique, MD  ? ?Chief Complaint  ?Patient presents with  ? Chest Pain  ? Fatigue  ? ? ?  ?History of Present Illness: ?Sandy Ballard is a 66 y.o. female who is seen at the request of Dr Ginette Otto for evaluation of exertional dyspnea and chest pain. She has a history of DM, HTN and thyroid disease. Also HLD. She was previously seen in 2015 by cardiology with [facility]. Myoview study was normal. Echo showed moderate LVH with normal EF. Normal valves.  ? ?When seen today she complains of profuse sweating and fatigue with any exertion. She sometimes feels SOB. She has severe reflux symptoms. States she has chest pain all the time. Seen recently by Dr Silverio Decamp and plans for EGD in May. Is being considered for possible gastric bypass. Also complain of left calf pain with exertion. Notes DM has been difficult to control but is improved from A1c 12.5%. Activity is also limited by fibromyalgia.  ? ?Past Medical History:  ?Diagnosis Date  ? Adenomatous colon polyp   ? Anxiety   ? Arthritis   ? Biliary dyskinesia   ? Diabetes ([place])   ? Duodenal ulcer   ? Duodenal ulcer   ? Fibromyalgia   ? GERD (gastroesophageal reflux disease)   ? H. pylori infection   ? Hiatal hernia   ? Hyperplastic colon polyp   ? Hypertension   ? IBS (irritable bowel syndrome)   ? Migraine   ? Thyroid disease   ? ? ?Past Surgical History:  ?Procedure Laterality Date  ? ABDOMINAL HYSTERECTOMY    ? CHOLECYSTECTOMY    ? LAPAROSCOPIC ABDOMINAL EXPLORATION    ? THYROID SURGERY    ? TONSILLECTOMY    ? ? ? ?Current Outpatient Medications  ?Medication Sig Dispense Refill  ? albuterol (PROVENTIL HFA;VENTOLIN HFA) 108 (90 Base) MCG/ACT inhaler Inhale 1 puff into the lungs every 4 (four) hours as needed.    ? albuterol (PROVENTIL) (2.5 MG/3ML) 0.083% nebulizer solution Inhale 2.5 mg into the lungs every 6 (six)  hours as needed.    ? amLODipine (NORVASC) 5 MG tablet Take 1 tablet by mouth daily.    ? aspirin 81 MG chewable tablet Chew 1 tablet by mouth daily.    ? bismuth-metronidazole-tetracycline (PYLERA) 140-125-125 MG capsule Take 3 capsules by mouth 4 (four) times daily -  before meals and at bedtime. 120 capsule 0  ? budesonide-formoterol (SYMBICORT) 160-4.5 MCG/ACT inhaler Inhale 2 puffs into the lungs 2 (two) times daily.    ? carvedilol (COREG) 25 MG tablet Take 1 tablet by mouth daily. Take 0.5 mg daily    ? cyclobenzaprine (FLEXERIL) 10 MG tablet Take 1 tablet by mouth 3 (three) times daily.    ? famotidine (PEPCID) 20 MG tablet Take 1 tablet (20 mg total) by mouth at bedtime. 30 tablet 3  ? folic acid (FOLVITE) 1 MG tablet Take 1 tablet by mouth daily.    ? gabapentin (NEURONTIN) 400 MG capsule Take 1 capsule by mouth 3 (three) times daily.    ? hydrochlorothiazide (HYDRODIURIL) 25 MG tablet Take 1 tablet by mouth daily.    ? hyoscyamine (LEVSIN, ANASPAZ) 0.125 MG tablet TAKE ONE TABLET BY MOUTH EVERY 6 HOURS AS NEEDED 30 tablet 2  ? levothyroxine (SYNTHROID, LEVOTHROID) 50 MCG tablet Take 1 tablet by mouth  daily.    ? metFORMIN (GLUCOPHAGE) 500 MG tablet Take 1 tablet by mouth 2 (two) times daily.    ? metoprolol tartrate (LOPRESSOR) 100 MG tablet Take 100 mg 2 hours before Coronary CT 1 tablet 0  ? potassium chloride SA (K-DUR,KLOR-CON) 20 MEQ tablet Take 1 tablet by mouth daily.    ? pramoxine-hydrocortisone (ANALPRAM HC) cream Apply topically 3 (three) times daily. Apply pea sized amount per rectum twice a day as needed 30 g 1  ? promethazine (PHENERGAN) 25 MG tablet Take 0.5 tablets (12.5 mg total) by mouth every 6 (six) hours as needed for nausea or vomiting. 15 tablet 0  ? sucralfate (CARAFATE) 1 g tablet Take 1 tablet (1 g total) by mouth 2 (two) times daily. 60 tablet 3  ? Vitamin D, Ergocalciferol, (DRISDOL) 50000 units CAPS capsule Take 1 capsule by mouth once a week.    ? ?Current  Facility-Administered Medications  ?Medication Dose Route Frequency Provider Last Rate Last Admin  ? 0.9 %  sodium chloride infusion  500 mL Intravenous Continuous Nandigam, Venia Minks, MD      ? ? ?Allergies:   Lisinopril, Ciprofloxacin, Erythromycin, and Tetracycline  ? ? ?Social History:  The patient  reports that she quit smoking about 7 years ago. Her smoking use included cigarettes. She has never used smokeless tobacco. She reports that she does not drink alcohol and does not use drugs.  ? ?Family History:  The patient's family history includes Breast cancer in her mother; Diabetes in her mother.  ? ? ?ROS:  Please see the history of present illness.   Otherwise, review of systems are positive for none.   All other systems are reviewed and negative.  ? ? ?PHYSICAL EXAM: ?VS:  BP (!) 144/78   Pulse 85   Ht 5' (1.524 m)   Wt 229 lb 12.8 oz (104.2 kg)   SpO2 97%   BMI 44.88 kg/m?  , BMI Body mass index is 44.88 kg/m?. ?GEN: Well nourished, obese BF  in no acute distress ?HEENT: normal ?Neck: no JVD, carotid bruits, or masses ?Cardiac: RRR; no murmurs, rubs, or gallops,no edema  ?Respiratory:  clear to auscultation bilaterally, normal work of breathing ?GI: soft, nontender, nondistended, + BS ?MS: no deformity or atrophy ?Skin: warm and dry, no rash ?Neuro:  Strength and sensation are intact ?Psych: euthymic mood, full affect ? ? ?EKG:  EKG is ordered today. ?The ekg ordered today demonstrates NSR rate 85. ST- T changes c/w inferolateral ischemia. I have personally reviewed and interpreted this study. ? ? ? ?Recent Labs: ?No results found for requested labs within last 8760 hours.  ? ? ?Lipid Panel ?No results found for: CHOL, TRIG, HDL, CHOLHDL, VLDL, LDLCALC, LDLDIRECT ?  ?Dated 09/30/20: cholesterol 220, triglycerides 116, HDL 62, LDL 135. ?Dated 07/19/21: AST 74, ALT 73. CMET and CBC normal.  ? ?Wt Readings from Last 3 Encounters:  ?09/20/21 229 lb 12.8 oz (104.2 kg)  ?09/11/21 230 lb (104.3 kg)  ?06/27/16  232 lb (105.2 kg)  ?  ? ? ?Other studies Reviewed: ?Additional studies/ records that were reviewed today include:  ? ?LEXISCAN NUCLEAR STRESS TEST  ?PROCEDURE DATE: 03/11/2014  ? ?Referring  Provider: Zimmer  ?Ordering Provider: Kizzie Ide  ? ?Pretest Symptoms: no symptoms (c.o lt sub mamm pain x 7 months).  ? ?Pharmacologic stress testing was performed.  The patient received  ?a 10-second intravenous injection of 0.4 mg of Lexiscan  ?(regadenoson), followed by a 5 mL saline flush and  the sestamibi  ?injection.  ? ?Comment:  (lungs clear Debbe Bales NP)  ? ?Low level exercise performed:  ?Low level exercise: leg kicks  ? ? ?Blood Pressure/Heart Rate:  ?Baseline Heart Rate: 76BPM.    ?During the regadenoson stress test the Heart Rate increased to:  ?105BPM  ?Resting Blood Pressure: 154/85 mmHg    ?Blood Pressure changed to (low): 159/59 mmHg  ?Blood Pressure changed to (high): 175/59 mmHg  ? ?Discontinuation of Stress Test:  ?The stress test was stopped due to: achieved protocol  ? ?Symptoms:  ?Anginal stress symptoms: no chest pain  ? ? ?Other Symptoms: lightheadedness, headache, nausea  ?Symptoms resolved with: rest  ? ?Myocardial perfusion imaging performed  ?Imaging protocol: 1-day rest/stress  ? ?Images were aquired at: rest, stress  ?Injection dose of 74mc-sestamibi (mCi) at rest: 13.1 mCi  ?IV access site (rest): left antecubital  ? ?Immediately following injection of regadenoson and saline flush:  ?Injection dose of 951m-sestamibi (mCi) at stress: 40.2 mCi    ?IV access site (stress): left antecubital  ? ?Attenuation correction is not available in [facility]'s nuclear laboratory.  ?Images Obtained: Gated stress (non gated rest).  ? ? ?Rest Injection  ?Name: kb  ?Date: 03/11/14  ?Time: 0830 Rest images obtained  ?Date: 03/11/14  ?Time: 0915 Procedure nurse  ?Name: bf  ?title: np  ?Stress Injection  ?Name: kb  ?Date: 03/11/14  ?Time: 1000 Stress images obtained  ?Date: 03/11/14  ?Time: 1105  Cardiac Tech  ?Name: kb  ?title: cnmt  ? ?EKG Findings: REST: Normal sinus rhythm. Non specific ST-T wave  ?changes.  ?  STRESS: Non specific ST-T wave changes, non diagnostic.  ? ?IMPRESSION:  ?Normal study.  ?Inferoseptal

## 2021-09-20 ENCOUNTER — Encounter: Payer: Self-pay | Admitting: Cardiology

## 2021-09-20 ENCOUNTER — Ambulatory Visit (INDEPENDENT_AMBULATORY_CARE_PROVIDER_SITE_OTHER): Payer: Medicare HMO | Admitting: Cardiology

## 2021-09-20 VITALS — BP 144/78 | HR 85 | Ht 60.0 in | Wt 229.8 lb

## 2021-09-20 DIAGNOSIS — R5382 Chronic fatigue, unspecified: Secondary | ICD-10-CM | POA: Diagnosis not present

## 2021-09-20 DIAGNOSIS — R079 Chest pain, unspecified: Secondary | ICD-10-CM | POA: Diagnosis not present

## 2021-09-20 DIAGNOSIS — M79605 Pain in left leg: Secondary | ICD-10-CM

## 2021-09-20 DIAGNOSIS — E7800 Pure hypercholesterolemia, unspecified: Secondary | ICD-10-CM

## 2021-09-20 DIAGNOSIS — E119 Type 2 diabetes mellitus without complications: Secondary | ICD-10-CM

## 2021-09-20 DIAGNOSIS — R072 Precordial pain: Secondary | ICD-10-CM

## 2021-09-20 DIAGNOSIS — Z794 Long term (current) use of insulin: Secondary | ICD-10-CM

## 2021-09-20 DIAGNOSIS — I10 Essential (primary) hypertension: Secondary | ICD-10-CM

## 2021-09-20 MED ORDER — METOPROLOL TARTRATE 100 MG PO TABS: ORAL_TABLET | ORAL | 0 refills | Status: DC

## 2021-09-20 NOTE — Addendum Note (Signed)
Addended by: Kathyrn Lass on: 09/20/2021 11:11 AM ? ? Modules accepted: Orders ? ?

## 2021-09-20 NOTE — Patient Instructions (Addendum)
Medication Instructions:  ?Continue same medications ? ? ?Lab Work: ?Art gallery manager ,lipid panel today ? ? ?Testing/Procedures: ?Coronary Ct will be scheduled after approved by insurance   Follow instructions below ? ?Lower Ext Arterial Dopplers ? ?Follow-Up: ?At [facility], you and your health needs are our priority.  As part of our continuing mission to provide you with exceptional heart care, we have created designated Provider Care Teams.  These Care Teams include your primary Cardiologist (physician) and Advanced Practice Providers (APPs -  Physician Assistants and Nurse Practitioners) who all work together to provide you with the care you need, when you need it. ? ?We recommend signing up for the patient portal called "MyChart".  Sign up information is provided on this After Visit Summary.  MyChart is used to connect with patients for Virtual Visits (Telemedicine).  Patients are able to view lab/test results, encounter notes, upcoming appointments, etc.  Non-urgent messages can be sent to your provider as well.   ?To learn more about what you can do with MyChart, go to [email or web address].   ? ?Your next appointment: After Test  ?  ? ?The format for your next appointment: Office ? ? ?Provider:  Dr.Jordan ? ? ? ? ?Your cardiac CT will be scheduled at one of the below locations:  ? ?[facility] ?[address] ?[place], [place] [address] ?[phone] ? ?OR ? ?[place] [address] ?[address] ?[place], [place] [address] ?[phone] ? ?If scheduled at [facility], please arrive at the [facility] and Children's Entrance (Entrance C2) of [facility] 30 minutes prior to test start time. ?You can use the FREE valet parking offered at entrance C (encouraged to control the heart rate for the test)  ?Proceed to the [facility] Radiology Department (first floor) to check-in and test prep. ? ?All radiology patients and guests should use entrance C2 at [facility], accessed from [facility], even though the hospital's physical address listed is [address]. ? ? ? ?If scheduled at [facility], please arrive 15 mins early for check-in and test prep. ? ?Please follow these instructions carefully (unless otherwise directed): ? ?Hold all erectile dysfunction medications at least 3 days (72 hrs) prior to test. ? ?On the Night Before the Test: ?Be sure to Drink plenty of water. ?Do not consume any caffeinated/decaffeinated beverages or chocolate 12 hours prior to your test. ?Do not take any antihistamines 12 hours prior to your test. ?           ? ?On the Day of the Test: ?Drink plenty of water until 1 hour prior to the test. ?Do not eat any food 4 hours prior to the test. ?You may take your regular medications prior to the test.  ?Take metoprolol 100 mg two hours prior to test. ?HOLD Hydrochlorothiazide morning of the test. ?FEMALES- please wear underwire-free bra if available, avoid dresses & tight clothing ?Hold Glipizide morning of test ?Hold Carvedilol morning of test ?            ? ?*For Clinical Staff only. Please instruct patient the following:* ? ?     ?After the Test: ?Drink plenty of water. ?After receiving IV contrast, you may experience a mild flushed feeling. This is normal. ?On occasion, you may experience a mild rash up to 24 hours after the test. This is not dangerous. If this occurs, you can take Benadryl 25 mg and increase your fluid intake. ?  If you experience trouble breathing, this can be serious. If it is severe call 911 IMMEDIATELY. If it is mild, please call our office. ?Hold Glipizide and Hold Metformin 2 days after test. ?       ? ?We will call to schedule your test 2-4 weeks out understanding that some insurance companies will need an authorization prior to the service being performed.  ? ?For non-scheduling related questions, please contact the cardiac imaging nurse navigator should you have  any questions/concerns: ?Marchia Bond, Cardiac Imaging Nurse Navigator ?Gordy Clement, Cardiac Imaging Nurse Navigator ?[facility] ?Direct Office Dial: [phone]  ? ?For scheduling needs, including cancellations and rescheduling, please call [place], [phone]. ? ?Important Information About Sugar ? ? ? ? ? ? ?

## 2021-09-28 ENCOUNTER — Telehealth: Payer: Self-pay | Admitting: Cardiology

## 2021-09-28 DIAGNOSIS — Z01818 Encounter for other preprocedural examination: Secondary | ICD-10-CM

## 2021-09-28 DIAGNOSIS — E876 Hypokalemia: Secondary | ICD-10-CM

## 2021-09-28 NOTE — Telephone Encounter (Signed)
Received a call from the pt starting they can not use the orders without the provider's signature. Spoke with pt's nurse, was able to get the signature. The orders were faxed to the provided number. Pt will call back if they do not receive the orders.  ?

## 2021-09-28 NOTE — Telephone Encounter (Signed)
Sandy Ballard with [facility] states they received BMET and lipid orders, but they need to be signed by Dr. Martinique before they can be completed. Please sign and resubmit fax as able. ? ?Phone#: [phone] ?Fax#: [phone] ?

## 2021-09-28 NOTE — Telephone Encounter (Signed)
Will make dr Doug Sou nurse, cheryl aware. ?

## 2021-09-28 NOTE — Telephone Encounter (Signed)
Lab tech called  with a critical  lab result    ?Potassium 2.9 ? ?Patient had Lipid and BMP today- result are being faxed ?

## 2021-09-28 NOTE — Telephone Encounter (Signed)
Reviewed information with Doctor of the day - Dr Claiborne Billings ? ? Per verbal order --  starting tonight  take an extra 40 meq  ? Tomorrow - 09/29/21  20 meq x 3  ?On 09/30/21- start taking 40 meq daily  ? ?Repeat  BMP  on 10/02/21   ? Order will be faxed to [facility] ? ? ?Patient has CCTA schedule for 10/04/21. ? ? ?Patient verbalized understanding  ?

## 2021-09-28 NOTE — Telephone Encounter (Signed)
Sandy Ballard from [facility] calling with lab results.  ? ? ?

## 2021-10-02 ENCOUNTER — Encounter (HOSPITAL_COMMUNITY): Payer: Medicare HMO

## 2021-10-02 ENCOUNTER — Telehealth: Payer: Self-pay | Admitting: Cardiology

## 2021-10-02 ENCOUNTER — Encounter: Payer: Self-pay | Admitting: Cardiology

## 2021-10-02 ENCOUNTER — Telehealth (HOSPITAL_COMMUNITY): Payer: Self-pay | Admitting: *Deleted

## 2021-10-02 NOTE — Telephone Encounter (Signed)
Received call, they were needing order to [facility] in [place]- I received fax number and the order was sent.  ?Thankful for call back. ? ?

## 2021-10-02 NOTE — Telephone Encounter (Signed)
Needing orders for pt to have blood work done. Call transferred to Triage ?

## 2021-10-02 NOTE — Telephone Encounter (Signed)
Attempted to call patient regarding upcoming cardiac CT appointment. ?Unable to leave voicemail. ? ?Gordy Clement RN Navigator Cardiac Imaging ?[facility] ?[phone] Office ?[phone] Cell ? ?

## 2021-10-03 ENCOUNTER — Telehealth (HOSPITAL_COMMUNITY): Payer: Self-pay | Admitting: *Deleted

## 2021-10-03 NOTE — Telephone Encounter (Signed)
Reaching out to patient to offer assistance regarding upcoming cardiac imaging study; pt verbalizes understanding of appt date/time, parking situation and where to check in, pre-test NPO status and medications ordered, and verified current allergies; name and call back number provided for further questions should they arise ? ?Gordy Clement RN Navigator Cardiac Imaging ?[facility] ?[phone] office ?[phone] cell ? ?Patient to take '100mg'$  metoprolol tartrate two hours prior to her cardiac CT scan. She is aware to arrive at 8am. She does report that they do some times have difficulty drawing blood.  ?

## 2021-10-04 ENCOUNTER — Encounter (HOSPITAL_COMMUNITY): Payer: Self-pay

## 2021-10-04 ENCOUNTER — Ambulatory Visit (HOSPITAL_COMMUNITY)
Admission: RE | Admit: 2021-10-04 | Discharge: 2021-10-04 | Disposition: A | Discharge status: Home / Self Care | Payer: Medicare HMO | Source: Ambulatory Visit | Attending: Cardiology | Admitting: Cardiology

## 2021-10-04 DIAGNOSIS — I10 Essential (primary) hypertension: Secondary | ICD-10-CM | POA: Insufficient documentation

## 2021-10-04 DIAGNOSIS — R5382 Chronic fatigue, unspecified: Secondary | ICD-10-CM | POA: Insufficient documentation

## 2021-10-04 DIAGNOSIS — E119 Type 2 diabetes mellitus without complications: Secondary | ICD-10-CM | POA: Diagnosis present

## 2021-10-04 DIAGNOSIS — R079 Chest pain, unspecified: Secondary | ICD-10-CM | POA: Insufficient documentation

## 2021-10-04 DIAGNOSIS — Z794 Long term (current) use of insulin: Secondary | ICD-10-CM | POA: Insufficient documentation

## 2021-10-04 DIAGNOSIS — R072 Precordial pain: Secondary | ICD-10-CM | POA: Insufficient documentation

## 2021-10-04 DIAGNOSIS — E7800 Pure hypercholesterolemia, unspecified: Secondary | ICD-10-CM | POA: Insufficient documentation

## 2021-10-04 DIAGNOSIS — M79605 Pain in left leg: Secondary | ICD-10-CM | POA: Insufficient documentation

## 2021-10-04 LAB — POCT I-STAT CREATININE: Creatinine, Ser: 0.7 mg/dL (ref 0.44–1.00)

## 2021-10-04 MED ORDER — NITROGLYCERIN 0.4 MG SL SUBL
SUBLINGUAL_TABLET | SUBLINGUAL | Status: AC
  Filled 2021-10-04: qty 2

## 2021-10-04 MED ORDER — IOHEXOL 350 MG/ML SOLN
100.0000 mL | Freq: Once | INTRAVENOUS | Status: AC | PRN
Start: 2021-10-04 — End: 2021-10-04
  Administered 2021-10-04: 100 mL via INTRAVENOUS

## 2021-10-04 MED ORDER — NITROGLYCERIN 0.4 MG SL SUBL
0.8000 mg | SUBLINGUAL_TABLET | Freq: Once | SUBLINGUAL | Status: AC
  Administered 2021-10-04: 0.8 mg via SUBLINGUAL

## 2021-10-12 ENCOUNTER — Other Ambulatory Visit: Payer: Self-pay

## 2021-10-12 DIAGNOSIS — I10 Essential (primary) hypertension: Secondary | ICD-10-CM

## 2021-10-12 DIAGNOSIS — E7800 Pure hypercholesterolemia, unspecified: Secondary | ICD-10-CM

## 2021-10-12 MED ORDER — ATORVASTATIN CALCIUM 10 MG PO TABS: 10.0000 mg | ORAL_TABLET | Freq: Every day | ORAL | 3 refills | Status: AC

## 2021-10-12 NOTE — Progress Notes (Signed)
lipitor

## 2021-10-13 ENCOUNTER — Encounter: Payer: Self-pay | Admitting: Gastroenterology

## 2021-10-13 ENCOUNTER — Ambulatory Visit: Payer: Medicare HMO | Admitting: Gastroenterology

## 2021-10-13 ENCOUNTER — Other Ambulatory Visit: Payer: Self-pay | Admitting: Cardiology

## 2021-10-13 DIAGNOSIS — M79605 Pain in left leg: Secondary | ICD-10-CM

## 2021-10-13 MED ORDER — SODIUM CHLORIDE 0.9 % IV SOLN: 500.0000 mL | Freq: Once | INTRAVENOUS | Status: DC

## 2021-10-13 NOTE — Progress Notes (Signed)
Pt CBG is 369, pt states she went off her metformin and is not taking anything for her diabetes because her Dr said it was causing her liver and kidneys to fail, per Dr Silverio Decamp instruct pt to go to urgent care today to address blood sugar, pt instructed to call PCP and begin taking medications for diabetes as not taking the medication is making her diabetes uncontrolled which will damage her blood vessels and kidneys not the medication causing these things.  Pt states she has an upcoming appt, let her know Dr Silverio Decamp said to go today either to urgent care or her PCP as we cannot do the procedure today and then to call us to reschedule when her diabetes is better controlled.  Pt verb understanding.

## 2021-10-20 ENCOUNTER — Ambulatory Visit (HOSPITAL_COMMUNITY)
Admission: RE | Admit: 2021-10-20 | Discharge: 2021-10-20 | Disposition: A | Discharge status: Home / Self Care | Payer: Medicare HMO | Source: Ambulatory Visit | Attending: Cardiology | Admitting: Cardiology

## 2021-10-20 DIAGNOSIS — R079 Chest pain, unspecified: Secondary | ICD-10-CM | POA: Diagnosis present

## 2021-10-20 DIAGNOSIS — E7800 Pure hypercholesterolemia, unspecified: Secondary | ICD-10-CM | POA: Insufficient documentation

## 2021-10-20 DIAGNOSIS — Z794 Long term (current) use of insulin: Secondary | ICD-10-CM | POA: Diagnosis present

## 2021-10-20 DIAGNOSIS — R072 Precordial pain: Secondary | ICD-10-CM | POA: Insufficient documentation

## 2021-10-20 DIAGNOSIS — R5382 Chronic fatigue, unspecified: Secondary | ICD-10-CM | POA: Diagnosis present

## 2021-10-20 DIAGNOSIS — M79605 Pain in left leg: Secondary | ICD-10-CM | POA: Diagnosis present

## 2021-10-20 DIAGNOSIS — E119 Type 2 diabetes mellitus without complications: Secondary | ICD-10-CM | POA: Insufficient documentation

## 2021-10-20 DIAGNOSIS — I10 Essential (primary) hypertension: Secondary | ICD-10-CM | POA: Insufficient documentation

## 2021-10-22 NOTE — Progress Notes (Deleted)
Cardiology Office Note   Date:  10/22/2021   ID:  Sandy Ballard, Sandy Ballard, Sandy Ballard, MRN [id number]  PCP:  Sandy Mater, MD  Cardiologist:   Sandy Wailes Martinique, MD   No chief complaint on file.     History of Present Illness: Sandy Ballard is a 66 y.o. female who is seen at the request of Sandy Ballard for evaluation of exertional dyspnea and chest pain. She has a history of DM, HTN and thyroid disease. Also HLD. She was previously seen in 2015 by cardiology with Sandy Ballard. Myoview study was normal. Echo showed moderate LVH with normal EF. Normal valves.   When seen last month she complained of profuse sweating and fatigue with any exertion. She sometimes feels SOB. She has severe reflux symptoms. States she has chest pain all the time. Seen recently by Sandy Ballard and plans for EGD in May. Is being considered for possible gastric bypass. Also complain of left calf pain with exertion. Notes DM has been difficult to control but is improved from A1c 12.5%. Activity is also limited by fibromyalgia.   We performed coronary CTA that showed mild nonobstructive CAD with calcium score of 40. LE arterial dopplers were normal.   Past Medical History:  Diagnosis Date   Adenomatous colon polyp    Anxiety    Arthritis    Biliary dyskinesia    Diabetes (Advance)    Duodenal ulcer    Duodenal ulcer    Fibromyalgia    GERD (gastroesophageal reflux disease)    H. pylori infection    Hiatal hernia    Hyperplastic colon polyp    Hypertension    IBS (irritable bowel syndrome)    Migraine    Thyroid disease     Past Surgical History:  Procedure Laterality Date   ABDOMINAL HYSTERECTOMY     CHOLECYSTECTOMY     LAPAROSCOPIC ABDOMINAL EXPLORATION     THYROID SURGERY     TONSILLECTOMY     UPPER GASTROINTESTINAL ENDOSCOPY       Current Outpatient Medications  Medication Sig Dispense Refill   albuterol (PROVENTIL HFA;VENTOLIN HFA) 108 (90 Base) MCG/ACT inhaler Inhale 1 puff into the lungs every 4  (four) hours as needed.     albuterol (PROVENTIL) (2.5 MG/3ML) 0.083% nebulizer solution Inhale 2.5 mg into the lungs every 6 (six) hours as needed.     amLODipine (NORVASC) 5 MG tablet Take 1 tablet by mouth daily.     aspirin 81 MG chewable tablet Chew 1 tablet by mouth daily.     atorvastatin (LIPITOR) 10 MG tablet Take 1 tablet (10 mg total) by mouth daily. 90 tablet 3   bismuth-metronidazole-tetracycline (PYLERA) 140-125-125 MG capsule Take 3 capsules by mouth 4 (four) times daily -  before meals and at bedtime. 120 capsule 0   budesonide-formoterol (SYMBICORT) 160-4.5 MCG/ACT inhaler Inhale 2 puffs into the lungs 2 (two) times daily.     carvedilol (COREG) 25 MG tablet Take 1 tablet by mouth daily. Take 0.5 mg daily     cyclobenzaprine (FLEXERIL) 10 MG tablet Take 1 tablet by mouth 3 (three) times daily.     famotidine (PEPCID) 20 MG tablet Take 1 tablet (20 mg total) by mouth at bedtime. 30 tablet 3   folic acid (FOLVITE) 1 MG tablet Take 1 tablet by mouth daily.     gabapentin (NEURONTIN) 400 MG capsule Take 1 capsule by mouth 3 (three) times daily.     hydrochlorothiazide (HYDRODIURIL) 25 MG tablet Take 1 tablet  by mouth daily.     hyoscyamine (LEVSIN, ANASPAZ) 0.125 MG tablet TAKE ONE TABLET BY MOUTH EVERY 6 HOURS AS NEEDED 30 tablet 2   levothyroxine (SYNTHROID, LEVOTHROID) 50 MCG tablet Take 1 tablet by mouth daily.     metFORMIN (GLUCOPHAGE) 500 MG tablet Take 1 tablet by mouth 2 (two) times daily.     metoprolol tartrate (LOPRESSOR) 100 MG tablet Take 100 mg 2 hours before Coronary CT 1 tablet 0   potassium chloride SA (K-DUR,KLOR-CON) 20 MEQ tablet Take 1 tablet by mouth daily.     pramoxine-hydrocortisone (ANALPRAM HC) cream Apply topically 3 (three) times daily. Apply pea sized amount per rectum twice a day as needed 30 g 1   promethazine (PHENERGAN) 25 MG tablet Take 0.5 tablets (12.5 mg total) by mouth every 6 (six) hours as needed for nausea or vomiting. 15 tablet 0    sucralfate (CARAFATE) 1 g tablet Take 1 tablet (1 g total) by mouth 2 (two) times daily. 60 tablet 3   Vitamin D, Ergocalciferol, (DRISDOL) 50000 units CAPS capsule Take 1 capsule by mouth once a week.     Current Facility-Administered Medications  Medication Dose Route Frequency Provider Last Rate Last Admin   0.9 %  sodium chloride infusion  500 mL Intravenous Continuous Nandigam, Kavitha V, MD       0.9 %  sodium chloride infusion  500 mL Intravenous Once Nandigam, Kavitha V, MD        Allergies:   Lisinopril, Ciprofloxacin, Erythromycin, and Tetracycline    Social History:  The patient  reports that she quit smoking about 7 years ago. Her smoking use included cigarettes. She has never used smokeless tobacco. She reports that she does not drink alcohol and does not use drugs.   Family History:  The patient's family history includes Breast cancer in her mother; Diabetes in her mother.    ROS:  Please see the history of present illness.   Otherwise, review of systems are positive for none.   All other systems are reviewed and negative.    PHYSICAL EXAM: VS:  There were no vitals taken for this visit. , BMI There is no height or weight on file to calculate BMI. GEN: Well nourished, obese BF  in no acute distress HEENT: normal Neck: no JVD, carotid bruits, or masses Cardiac: RRR; no murmurs, rubs, or gallops,no edema  Respiratory:  clear to auscultation bilaterally, normal work of breathing GI: soft, nontender, nondistended, + BS MS: no deformity or atrophy Skin: warm and dry, no rash Neuro:  Strength and sensation are intact Psych: euthymic mood, full affect   EKG:  EKG is ordered today. The ekg ordered today demonstrates NSR rate 85. ST- T changes c/w inferolateral ischemia. I have personally reviewed and interpreted this study.    Recent Labs: 10/04/2021: Creatinine, Ser 0.70    Lipid Panel No results found for: CHOL, TRIG, HDL, CHOLHDL, VLDL, LDLCALC, LDLDIRECT   Dated  09/30/20: cholesterol 220, triglycerides 116, HDL 62, LDL 135. Dated 07/19/21: AST 74, ALT 73. CMET and CBC normal.   Wt Readings from Last 3 Encounters:  09/20/21 229 lb 12.8 oz (104.2 kg)  09/11/21 230 lb (104.3 kg)  06/27/16 232 lb (105.2 kg)      Other studies Reviewed: Additional studies/ records that were reviewed today include:   LEXISCAN NUCLEAR STRESS TEST  PROCEDURE DATE: 03/11/2014   Referring  Provider: Ginette Ballard  Ordering Provider: Kizzie Ide   Pretest Symptoms: no symptoms (c.o lt sub mamm  pain x 7 months).   Pharmacologic stress testing was performed.  The patient received  a 10-second intravenous injection of 0.4 mg of Lexiscan  (regadenoson), followed by a 5 mL saline flush and the sestamibi  injection.   Comment:  (lungs clear Debbe Bales NP)   Low level exercise performed:  Low level exercise: leg kicks    Blood Pressure/Heart Rate:  Baseline Heart Rate: 76BPM.    During the regadenoson stress test the Heart Rate increased to:  105BPM  Resting Blood Pressure: 154/85 mmHg    Blood Pressure changed to (low): 159/59 mmHg  Blood Pressure changed to (high): 175/59 mmHg   Discontinuation of Stress Test:  The stress test was stopped due to: achieved protocol   Symptoms:  Anginal stress symptoms: no chest pain    Other Symptoms: lightheadedness, headache, nausea  Symptoms resolved with: rest   Myocardial perfusion imaging performed  Imaging protocol: 1-day rest/stress   Images were aquired at: rest, stress  Injection dose of 31mc-sestamibi (mCi) at rest: 13.1 mCi  IV access site (rest): left antecubital   Immediately following injection of regadenoson and saline flush:  Injection dose of 978m-sestamibi (mCi) at stress: 40.2 mCi    IV access site (stress): left antecubital   Attenuation correction is not available in [facility].  Images Obtained: Gated stress (non gated rest).    Rest Injection  Name: kb   Date: 03/11/14  Time: 0830 Rest images obtained  Date: 03/11/14  Time: 0915 Procedure nurse  Name: bf  title: np  Stress Injection  Name: kb  Date: 03/11/14  Time: 1000 Stress images obtained  Date: 03/11/14  Time: 1105 Cardiac Tech  Name: kb  title: cnmt   EKG Findings: REST: Normal sinus rhythm. Non specific ST-T wave  changes.    STRESS: Non specific ST-T wave changes, non diagnostic.   IMPRESSION:  Normal study.  Inferoseptal hot spot; consider left ventricular hypertrophy.  Visually EF > 65%.( calculated at 50%).   Interpreted and approved by: Sandy. JaOswaldo Done Date:  03-11-2014  Echo 03/11/14:Summary   1. Overall left ventricular ejection fraction is estimated at 60 to 65%.   2. Normal global left ventricular systolic function.   3. (Grade 1) Mildly abnormal left ventricular diastolic filling.   4. Moderate concentric left ventricular hypertrophy.   5. Mild mitral annular calcification.   6. Elevated left ventricular end diastolic pressure by tissue Doppler.   7. No intracardiac thrombi, mass or vegetations.   ADDENDUM REPORT: 10/04/2021 12:47   CLINICAL DATA:  33F with chest pain   EXAM: Cardiac/Coronary CTA   TECHNIQUE: The patient was scanned on a [employer]  FINDINGS: A 100 kV prospective scan was triggered in the descending thoracic aorta at 111 HU's. Axial non-contrast 3 mm slices were carried out through the heart. The data set was analyzed on a dedicated work station and scored using the [employer] rotation speed was 250 msecs and collimation was .6 mm. 0.8 mg of sl NTG was given. The 3D data set was reconstructed in 5% intervals of the 35-75 % of the R-R cycle. Phases were analyzed on a dedicated work station using MPR, MIP and VRT modes. The patient received 80 cc of contrast.   Coronary Arteries:  Normal coronary origin.  Left dominance.   RCA is small and nondominant   Left main is a large artery that  gives rise to LAD and LCX arteries.  LAD is a large vessel that has no plaque. Calcified plaque in the proximal LAD causes 0-24% stenosis   LCX is a non-dominant artery that gives rise to one large OM1 branch. Noncalcified plaque in the distal LCX causes 0-24% stenosis   Other findings:   Left Ventricle: Normal size   Left Atrium: Mild enlargement   Pulmonary Veins: Normal configuration   Right Ventricle: Normal size   Right Atrium: Normal size   Cardiac valves: Mitral annular calcification   Thoracic aorta: Normal size   Pulmonary Arteries: Normal size   Systemic Veins: Normal drainage   Pericardium: Normal thickness   IMPRESSION: 1. Coronary calcium score of 40. This was 81st percentile for age and sex matched control.   2. Normal coronary origin with left dominance.   3. Nonobstructive CAD with calcified plaque in proximal LAD and noncalcified plaque in distal LCX causing minimal (0-24%) stenosis   CAD-RADS 1. Minimal non-obstructive CAD (0-24%). Consider non-atherosclerotic causes of chest pain. Consider preventive therapy and risk factor modification.     Electronically Signed   By: Oswaldo Milian M.D.   On: 10/04/2021 12:47    LE arterial dopplers 10/20/21: Summary:  Right: Resting right ankle-brachial index is within normal range. No  evidence of significant right lower extremity arterial disease. The right  toe-brachial index is abnormal.   Left: Resting left ankle-brachial index is within normal range. No  evidence of significant left lower extremity arterial disease. The left  toe-brachial index is normal.   ASSESSMENT AND PLAN:  1.  Patient with symptoms of exertional fatigue and profuse sweating. Also constant chest pain. Multiple cardiac risk factors including DM, HTN, HLD, and obesity.  Fortunately coronary CTA with minimal nonobstructive CAD. Recommend risk factor modification. CV risk is low.  2. Left calf pain with exertion. Normal   LE arterial dopplers 3. DM type 2 on insulin 4. HTN 5. HLD.  6. Elevated transaminases. Almost certainly due to fatty liver. Per GI 7. GERD. Plans for upper EGD.    Current medicines are reviewed at length with the patient today.  The patient does not have concerns regarding medicines.  The following changes have been made:  no change  Labs/ tests ordered today include:   No orders of the defined types were placed in this encounter.        Disposition:   FU with me after above studies    Signed, Myracle Febres Martinique, MD  10/22/2021 3:38 PM    [facility] [address], [place], [place], [address] Phone [phone], Fax [phone]

## 2021-10-27 ENCOUNTER — Ambulatory Visit: Payer: Medicare HMO | Admitting: Cardiology

## 2022-01-27 ENCOUNTER — Other Ambulatory Visit: Payer: Self-pay | Admitting: Cardiology

## 2022-02-14 ENCOUNTER — Ambulatory Visit: Payer: Medicare HMO | Admitting: Cardiology

## 2022-03-05 ENCOUNTER — Encounter: Payer: Self-pay | Admitting: Gastroenterology

## 2022-03-05 ENCOUNTER — Telehealth: Payer: Self-pay | Admitting: Gastroenterology

## 2022-04-02 ENCOUNTER — Telehealth: Payer: Self-pay | Admitting: *Deleted

## 2022-04-02 ENCOUNTER — Encounter: Payer: Self-pay | Admitting: Gastroenterology

## 2022-04-02 ENCOUNTER — Ambulatory Visit (AMBULATORY_SURGERY_CENTER): Payer: Medicare HMO | Admitting: *Deleted

## 2022-04-02 VITALS — Ht 61.0 in | Wt 213.0 lb

## 2022-04-02 DIAGNOSIS — R1013 Epigastric pain: Secondary | ICD-10-CM

## 2022-04-02 DIAGNOSIS — R1310 Dysphagia, unspecified: Secondary | ICD-10-CM

## 2022-04-02 NOTE — Telephone Encounter (Signed)
Pt.had pre-visit today,she is requesting to have a colonoscopy along with egd which is scheduled for 04/09/22,she stated she has had problems with bloating,abdominal pain,spasms,please review chart and advise?

## 2022-04-02 NOTE — Telephone Encounter (Signed)
Pt. Had pre-visit today,she is scheduled for a EGD ON 04/09/22,She is requesting to have a colonoscopy also because she has been having cramping,bloating abdominal swelling and problems with hemorrhoids,please review and advise,explained to her that date of procedure may have to change depending on availability?

## 2022-04-02 NOTE — Progress Notes (Signed)
No egg or soy allergy known to patient  No issues known to pt with past sedation with any surgeries or procedures Patient denies ever being told they had issues or difficulty with intubation  No FH of Malignant Hyperthermia Pt is not on diet pills Pt is not on  home 02  Pt is not on blood thinners  Pt  issues with constipation in last 3-4 months No A fib or A flutter Have any cardiac testing pending--no Pt instructed to use Singlecare.com or GoodRx for a price reduction on prep    Patient's chart reviewed by Osvaldo Angst CNRA prior to previsit and patient appropriate for the [facility].  Previsit completed and red dot placed by patient's name on their procedure day (on provider's schedule).

## 2022-04-04 ENCOUNTER — Telehealth: Payer: Self-pay

## 2022-04-04 ENCOUNTER — Telehealth: Payer: Self-pay | Admitting: Gastroenterology

## 2022-04-04 NOTE — Telephone Encounter (Signed)
Call transferred from [facility]. The patient is asking to be scheduled for a colonoscopy with the EGD due to her ongoing complaints of constipation and hemorrhoids. States she is following the diet and medications instructions she was given at her visit, but she is not improving.  Please advise.

## 2022-04-04 NOTE — Telephone Encounter (Signed)
Upcoming EGD on 04-09-22. Called inquiring about piercing's.  She has 6 piercings on each ear and   one in her nose, states they have to be screwed in, so she wanted to remove them before her procedure if need be. There ae no piercing's in her mouth or tongue.

## 2022-04-04 NOTE — Telephone Encounter (Signed)
Pt transferred to Nandigam's nurse. Please see phone note from 04/04/22 from [place].

## 2022-04-04 NOTE — Telephone Encounter (Signed)
Patient just wanted to know if she was okay to wear her earring for upcoming egd. Pt also stated she was having "issues" and wanted to know if MD was doing both the egd and colon. Explained to patient that she was scheduled for just the EGD, and that she would need to speak with MD nurse regarding getting scheduled for the colonoscopy.

## 2022-04-05 ENCOUNTER — Other Ambulatory Visit: Payer: Self-pay

## 2022-04-05 DIAGNOSIS — R1013 Epigastric pain: Secondary | ICD-10-CM

## 2022-04-05 DIAGNOSIS — K625 Hemorrhage of anus and rectum: Secondary | ICD-10-CM

## 2022-04-05 MED ORDER — NA SULFATE-K SULFATE-MG SULF 17.5-3.13-1.6 GM/177ML PO SOLN: ORAL | 0 refills | Status: DC

## 2022-04-05 NOTE — Telephone Encounter (Signed)
Patient is not due for surveillance colonoscopy until 2025 based on recent guidelines for follow-up colonoscopy.  We can consider earlier exam for evaluation of her symptoms but will be considered diagnostic (change in bowel habits, worsening constipation, abdominal bloating and hemorrhoids], insurance coverage may vary.  Please check if patient wants to still go ahead with colonoscopy, we can potentially reschedule both to November 15 if she wants to proceed

## 2022-04-05 NOTE — Telephone Encounter (Signed)
Patient contacted. Advised the procedure can be added. It will be moved to 04/11/22. Explained that this will be considered diagnostic and not screening which will probably change the coverage from her insurance company. Patient elects to add the colonoscopy. Instructions emailed to her through [facility] secure email. She does not have My Chart, she lives in [place]. She is unable to come to [place] to pick up her written instructions. She states she is familiar with the prepping process.

## 2022-04-05 NOTE — Telephone Encounter (Signed)
Patient contacted. Advised the procedure can be added. It will be moved to 04/11/22. Explained that this will be considered diagnostic and not screening which will probably change the coverage from her insurance company. Patient elects to add the colonoscopy. Instructions emailed to her through [facility] secure email. She does not have My Chart, she lives in [place]. She is unable to come to  to pick up her written instructions. She states she is familiar with the prepping process.

## 2022-04-06 ENCOUNTER — Telehealth: Payer: Self-pay | Admitting: Gastroenterology

## 2022-04-06 NOTE — Telephone Encounter (Signed)
Resent instructions with patient mychart.  Confirmed patient was able to access mychart. Confirmed prescription sent yesterday to [facility] in [place] , [place]

## 2022-04-06 NOTE — Telephone Encounter (Signed)
SEE F/U TE

## 2022-04-06 NOTE — Telephone Encounter (Signed)
Inbound call from patient needing prep instructions for double procedure. Please update via MyChart . Please give patient a f/u call

## 2022-04-09 ENCOUNTER — Encounter: Payer: Medicare HMO | Admitting: Gastroenterology

## 2022-04-11 ENCOUNTER — Encounter: Payer: Self-pay | Admitting: Gastroenterology

## 2022-04-11 ENCOUNTER — Ambulatory Visit (AMBULATORY_SURGERY_CENTER): Payer: Medicare HMO | Admitting: Gastroenterology

## 2022-04-11 VITALS — BP 157/82 | HR 87 | Temp 97.3°F | Resp 18 | Ht 61.0 in | Wt 213.0 lb

## 2022-04-11 DIAGNOSIS — R1319 Other dysphagia: Secondary | ICD-10-CM | POA: Diagnosis not present

## 2022-04-11 DIAGNOSIS — D123 Benign neoplasm of transverse colon: Secondary | ICD-10-CM

## 2022-04-11 DIAGNOSIS — K257 Chronic gastric ulcer without hemorrhage or perforation: Secondary | ICD-10-CM

## 2022-04-11 DIAGNOSIS — K222 Esophageal obstruction: Secondary | ICD-10-CM

## 2022-04-11 DIAGNOSIS — K2100 Gastro-esophageal reflux disease with esophagitis, without bleeding: Secondary | ICD-10-CM

## 2022-04-11 DIAGNOSIS — K2990 Gastroduodenitis, unspecified, without bleeding: Secondary | ICD-10-CM | POA: Diagnosis not present

## 2022-04-11 DIAGNOSIS — K625 Hemorrhage of anus and rectum: Secondary | ICD-10-CM

## 2022-04-11 DIAGNOSIS — Z86010 Personal history of colonic polyps: Secondary | ICD-10-CM

## 2022-04-11 DIAGNOSIS — K2970 Gastritis, unspecified, without bleeding: Secondary | ICD-10-CM

## 2022-04-11 DIAGNOSIS — R1013 Epigastric pain: Secondary | ICD-10-CM

## 2022-04-11 DIAGNOSIS — Z09 Encounter for follow-up examination after completed treatment for conditions other than malignant neoplasm: Secondary | ICD-10-CM

## 2022-04-11 DIAGNOSIS — K2950 Unspecified chronic gastritis without bleeding: Secondary | ICD-10-CM

## 2022-04-11 DIAGNOSIS — K319 Disease of stomach and duodenum, unspecified: Secondary | ICD-10-CM | POA: Diagnosis not present

## 2022-04-11 MED ORDER — LIDOCAINE VISCOUS HCL 2 % MT SOLN: 15.0000 mL | OROMUCOSAL | 0 refills | Status: AC | PRN

## 2022-04-11 MED ORDER — SODIUM CHLORIDE 0.9 % IV SOLN: 500.0000 mL | Freq: Once | INTRAVENOUS | Status: DC

## 2022-04-11 MED ORDER — PANTOPRAZOLE SODIUM 40 MG PO TBEC: 40.0000 mg | DELAYED_RELEASE_TABLET | Freq: Two times a day (BID) | ORAL | 2 refills | Status: AC

## 2022-04-11 NOTE — Progress Notes (Signed)
Called to room to assist during endoscopic procedure.  Patient ID and intended procedure confirmed with present staff. Received instructions for my participation in the procedure from the performing physician.  

## 2022-04-11 NOTE — Patient Instructions (Addendum)
Handouts provided on esophagitis, gastritis, polyps, diverticulosis, hemorrhoids and hemorrhoid banding.   Resume previous diet. Continue present medications.  Await pathology results.  Follow an antireflux regimen (see handout).  Use Protonix (pantoprazole) '40mg'$  by mouth twice daily for 3 months.  Use Lidocaine 2% solution- 25m every 3 hours as needed for mouth pain.  Return to GI office at the next available appointment for follow up and hemorrhoidal band ligation. Please call the office to schedule an appointment.  Repeat colonoscopy in 5-10 years for surveillance based on pathology results.   [facility]   Refer to the procedure report that was given to you for any specific questions about what was found during the examination.  If the procedure report does not answer your questions, please call your gastroenterologist to clarify.  If you requested that your care partner not be given the details of your procedure findings, then the procedure report has been included in a sealed envelope for you to review at your convenience later.  YOU SHOULD EXPECT: Some feelings of bloating in the abdomen. Passage of more gas than usual.  Walking can help get rid of the air that was put into your GI tract during the procedure and reduce the bloating. If you had a lower endoscopy (such as a colonoscopy or flexible sigmoidoscopy) you may notice spotting of blood in your stool or on the toilet paper. If you underwent a bowel prep for your procedure, you may not have a normal bowel movement for a few days.  Please Note:  You might notice some irritation and congestion in your nose or some drainage.  This is from the oxygen used during your procedure.  There is no need for concern and it should clear up in a day or so.  SYMPTOMS TO REPORT IMMEDIATELY:  Following lower endoscopy (colonoscopy or flexible sigmoidoscopy):  Excessive amounts of blood in the  stool  Significant tenderness or worsening of abdominal pains  Swelling of the abdomen that is new, acute  Fever of 100F or higher  Following upper endoscopy (EGD)  Vomiting of blood or coffee ground material  New chest pain or pain under the shoulder blades  Painful or persistently difficult swallowing  New shortness of breath  Fever of 100F or higher  Black, tarry-looking stools  For urgent or emergent issues, a gastroenterologist can be reached at any hour by calling [phone] Do not use MyChart messaging for urgent concerns.    DIET:  We do recommend a small meal at first, but then you may proceed to your regular diet.  Drink plenty of fluids but you should avoid alcoholic beverages for 24 hours.  ACTIVITY:  You should plan to take it easy for the rest of today and you should NOT DRIVE or use heavy machinery until tomorrow (because of the sedation medicines used during the test).    FOLLOW UP: Our staff will call the number listed on your records the next business day following your procedure.  We will call around 7:15- 8:00 am to check on you and address any questions or concerns that you may have regarding the information given to you following your procedure. If we do not reach you, we will leave a message.     If any biopsies were taken you will be contacted by phone or by letter within the next 1-3 weeks.  Please call [email or web address] ([phone] you have not heard about the  biopsies in 3 weeks.    SIGNATURES/CONFIDENTIALITY: You and/or your care partner have signed paperwork which will be entered into your electronic medical record.  These signatures attest to the fact that that the information above on your After Visit Summary has been reviewed and is understood.  Full responsibility of the confidentiality of this discharge information lies with you and/or your care-partner.

## 2022-04-11 NOTE — Op Note (Addendum)
[facility] Patient Name: Sandy Ballard Procedure Date: 04/11/2022 3:23 PM MRN: [id number] Endoscopist: Mauri Pole , MD, [id number] Age: 66 Referring MD:  Date of Birth: [birth year] Gender: Female Account #: [id number] Procedure:                Upper GI endoscopy Indications:              Dysphagia, Epigastric abdominal pain Medicines:                Monitored Anesthesia Care Procedure:                Pre-Anesthesia Assessment:                           - Prior to the procedure, a History and Physical                            was performed, and patient medications and                            allergies were reviewed. The patient's tolerance of                            previous anesthesia was also reviewed. The risks                            and benefits of the procedure and the sedation                            options and risks were discussed with the patient.                            All questions were answered, and informed consent                            was obtained. Prior Anticoagulants: The patient has                            taken no anticoagulant or antiplatelet agents. ASA                            Grade Assessment: III - A patient with severe                            systemic disease. After reviewing the risks and                            benefits, the patient was deemed in satisfactory                            condition to undergo the procedure.                           After obtaining informed consent, the endoscope was  passed under direct vision. Throughout the                            procedure, the patient's blood pressure, pulse, and                            oxygen saturations were monitored continuously. The                            GIF [id number] #[id number] was introduced through the                            mouth, and advanced to the second part of duodenum.                            The  upper GI endoscopy was accomplished without                            difficulty. The patient tolerated the procedure                            well. Scope In: Scope Out: Findings:                 LA Grade C (one or more mucosal breaks continuous                            between tops of 2 or more mucosal folds, less than                            75% circumference) esophagitis with no bleeding was                            found 36 to 38 cm from the incisors. Biopsies were                            taken with a cold forceps for histology.                           One benign-appearing, intrinsic mild stenosis was                            found 35 to 37 cm from the incisors. This stenosis                            measured 1 cm (in length). The stenosis was                            traversed. The scope was withdrawn. Dilation was                            performed with a Maloney dilator with no resistance  at 29 Fr. The dilation site was examined following                            endoscope reinsertion and showed mild mucosal                            disruption.                           Patchy moderate inflammation characterized by                            congestion (edema), erosions and erythema was found                            in the gastric antrum and in the prepyloric region                            of the stomach. Biopsies were taken with a cold                            forceps for Helicobacter pylori testing.                           The cardia and gastric fundus were normal on                            retroflexion.                           The examined duodenum was normal. Complications:            No immediate complications. Estimated Blood Loss:     Estimated blood loss was minimal. Impression:               - LA Grade C reflux esophagitis with no bleeding.                            Biopsied.                            - Benign-appearing esophageal stenosis. Dilated.                           - Gastritis. Biopsied.                           - Normal examined duodenum. Recommendation:           - Patient has a contact number available for                            emergencies. The signs and symptoms of potential                            delayed complications were discussed with the  patient. Return to normal activities tomorrow.                            Written discharge instructions were provided to the                            patient.                           - Resume previous diet.                           - Continue present medications.                           - Await pathology results.                           - Follow an antireflux regimen.                           - Use Protonix (pantoprazole) 40 mg PO BID for 3                            months.                           - Return to GI office at the next available                            appointment. Mauri Pole, MD 04/11/2022 4:28:26 PM This report has been signed electronically.

## 2022-04-11 NOTE — Progress Notes (Unsigned)
[facility] History and Physical   Primary Care Physician:  Olena Mater, MD   Reason for Procedure:  Dysphagia, epigastric pain, h/o adenomatous colon polyps  Plan:    EGD and colonoscopy with possible interventions as needed     HPI: Sandy Ballard is a very pleasant 66 y.o. female here for EGD and colonoscopy for dysphagia, epigastric pain and colon adenomatous polyp.   The risks and benefits as well as alternatives of endoscopic procedure(s) have been discussed and reviewed. All questions answered. The patient agrees to proceed.    Past Medical History:  Diagnosis Date   Adenomatous colon polyp    Allergy    hayfever   Anxiety    Arthritis    Asthma    Biliary dyskinesia    Blood transfusion without reported diagnosis    Clotting disorder ([place])    Diabetes ([place])    Duodenal ulcer    Duodenal ulcer    DVT (deep venous thrombosis) ([facility])    1989,leg left   Fibromyalgia    GERD (gastroesophageal reflux disease)    H. pylori infection    Hiatal hernia    Hyperlipidemia    Hyperplastic colon polyp    Hypertension    IBS (irritable bowel syndrome)    Migraine    Thyroid disease     Past Surgical History:  Procedure Laterality Date   ABDOMINAL HYSTERECTOMY     CHOLECYSTECTOMY     COLONOSCOPY     LAPAROSCOPIC ABDOMINAL EXPLORATION     SIGMOIDOSCOPY     THYROID SURGERY     TONSILLECTOMY     UPPER GASTROINTESTINAL ENDOSCOPY      Prior to Admission medications   Medication Sig Start Date End Date Taking? Authorizing Provider  amLODipine (NORVASC) 5 MG tablet Take 1 tablet by mouth daily. 05/02/16  Yes [provider]  aspirin 81 MG chewable tablet Chew 1 tablet by mouth daily. 07/01/15  Yes [provider]  carvedilol (COREG) 25 MG tablet Take 1 tablet by mouth daily. Take 0.5 mg daily 11/18/15  Yes [provider]  cyclobenzaprine (FLEXERIL) 10 MG tablet Take 1 tablet by mouth 3 (three) times daily. 03/29/16  Yes  [provider]  folic acid (FOLVITE) 1 MG tablet Take 1 tablet by mouth daily.   Yes [provider]  hydrochlorothiazide (HYDRODIURIL) 25 MG tablet Take 1 tablet by mouth daily. 05/24/16  Yes [provider]  levothyroxine (SYNTHROID, LEVOTHROID) 50 MCG tablet Take 1 tablet by mouth daily. 11/18/15  Yes [provider]  potassium chloride SA (K-DUR,KLOR-CON) 20 MEQ tablet Take 1 tablet by mouth daily. 11/18/15  Yes [provider]  sucralfate (CARAFATE) 1 g tablet Take 1 tablet (1 g total) by mouth 2 (two) times daily. 09/11/21  Yes Raynesha Tiedt, Venia Minks, MD  VICTOZA 18 MG/3ML SOPN Inject into the skin.   Yes [provider]  Vitamin D, Ergocalciferol, (DRISDOL) 50000 units CAPS capsule Take 1 capsule by mouth once a week. 07/01/15  Yes [provider]  albuterol (PROVENTIL HFA;VENTOLIN HFA) 108 (90 Base) MCG/ACT inhaler Inhale 1 puff into the lungs every 4 (four) hours as needed.    [provider]  albuterol (PROVENTIL) (2.5 MG/3ML) 0.083% nebulizer solution Inhale 2.5 mg into the lungs every 6 (six) hours as needed. 02/23/14   [provider]  atorvastatin (LIPITOR) 10 MG tablet Take 1 tablet (10 mg total) by mouth daily. Patient not taking: Reported on 04/11/2022 10/12/21   Martinique, Peter M,  MD  budesonide-formoterol (SYMBICORT) 160-4.5 MCG/ACT inhaler Inhale 2 puffs into the lungs 2 (two) times daily. 12/05/15   [provider]  fluconazole (DIFLUCAN) 100 MG tablet Take by mouth. Patient not taking: Reported on 04/02/2022 03/23/22   [provider]  gabapentin (NEURONTIN) 400 MG capsule Take 1 capsule by mouth 3 (three) times daily. 04/27/16   [provider]  hyoscyamine (LEVSIN, ANASPAZ) 0.125 MG tablet TAKE ONE TABLET BY MOUTH EVERY 6 HOURS AS NEEDED 10/29/17   Mauri Pole, MD  promethazine (PHENERGAN) 25 MG tablet Take 0.5 tablets (12.5 mg total) by mouth every 6 (six) hours as needed for  nausea or vomiting. 01/08/14   Lafayette Dragon, MD    Current Outpatient Medications  Medication Sig Dispense Refill   amLODipine (NORVASC) 5 MG tablet Take 1 tablet by mouth daily.     aspirin 81 MG chewable tablet Chew 1 tablet by mouth daily.     carvedilol (COREG) 25 MG tablet Take 1 tablet by mouth daily. Take 0.5 mg daily     cyclobenzaprine (FLEXERIL) 10 MG tablet Take 1 tablet by mouth 3 (three) times daily.     folic acid (FOLVITE) 1 MG tablet Take 1 tablet by mouth daily.     hydrochlorothiazide (HYDRODIURIL) 25 MG tablet Take 1 tablet by mouth daily.     levothyroxine (SYNTHROID, LEVOTHROID) 50 MCG tablet Take 1 tablet by mouth daily.     potassium chloride SA (K-DUR,KLOR-CON) 20 MEQ tablet Take 1 tablet by mouth daily.     sucralfate (CARAFATE) 1 g tablet Take 1 tablet (1 g total) by mouth 2 (two) times daily. 60 tablet 3   VICTOZA 18 MG/3ML SOPN Inject into the skin.     Vitamin D, Ergocalciferol, (DRISDOL) 50000 units CAPS capsule Take 1 capsule by mouth once a week.     albuterol (PROVENTIL HFA;VENTOLIN HFA) 108 (90 Base) MCG/ACT inhaler Inhale 1 puff into the lungs every 4 (four) hours as needed.     albuterol (PROVENTIL) (2.5 MG/3ML) 0.083% nebulizer solution Inhale 2.5 mg into the lungs every 6 (six) hours as needed.     atorvastatin (LIPITOR) 10 MG tablet Take 1 tablet (10 mg total) by mouth daily. (Patient not taking: Reported on 04/11/2022) 90 tablet 3   budesonide-formoterol (SYMBICORT) 160-4.5 MCG/ACT inhaler Inhale 2 puffs into the lungs 2 (two) times daily.     fluconazole (DIFLUCAN) 100 MG tablet Take by mouth. (Patient not taking: Reported on 04/02/2022)     gabapentin (NEURONTIN) 400 MG capsule Take 1 capsule by mouth 3 (three) times daily.     hyoscyamine (LEVSIN, ANASPAZ) 0.125 MG tablet TAKE ONE TABLET BY MOUTH EVERY 6 HOURS AS NEEDED 30 tablet 2   promethazine (PHENERGAN) 25 MG tablet Take 0.5 tablets (12.5 mg total) by mouth every 6 (six) hours as needed for  nausea or vomiting. 15 tablet 0   Current Facility-Administered Medications  Medication Dose Route Frequency Provider Last Rate Last Admin   0.9 %  sodium chloride infusion  500 mL Intravenous Continuous Kaion Tisdale V, MD       0.9 %  sodium chloride infusion  500 mL Intravenous Once Alezander Dimaano V, MD       0.9 %  sodium chloride infusion  500 mL Intravenous Once Mauri Pole, MD        Allergies as of 04/11/2022 - Review Complete 04/11/2022  Allergen Reaction Noted   Lisinopril Anaphylaxis 06/08/2016   Ciprofloxacin Swelling 03/30/2010  Nitrofurantoin Rash 02/09/2022   Erythromycin  11/21/2009   Tetracycline  11/21/2009    Family History  Problem Relation Age of Onset   Breast cancer Mother    Diabetes Mother    Colon cancer Neg Hx    Stomach cancer Neg Hx    Esophageal cancer Neg Hx    Colon polyps Neg Hx    Crohn's disease Neg Hx    Rectal cancer Neg Hx    Ulcerative colitis Neg Hx     Social History   Socioeconomic History   Marital status: Married    Spouse name: Not on file   Number of children: 1   Years of education: Not on file   Highest education level: Not on file  Occupational History   Occupation: Disabled  Tobacco Use   Smoking status: Former    Types: Cigarettes    Quit date: 01/04/2014    Years since quitting: 8.2    Passive exposure: Never   Smokeless tobacco: Never  Vaping Use   Vaping Use: Never used  Substance and Sexual Activity   Alcohol use: No   Drug use: No   Sexual activity: Not Currently    Birth control/protection: Post-menopausal, Surgical  Other Topics Concern   Not on file  Social History Narrative   Not on file   Social Determinants of Health   Financial Resource Strain: Not on file  Food Insecurity: Not on file  Transportation Needs: Not on file  Physical Activity: Not on file  Stress: Not on file  Social Connections: Not on file  Intimate Partner Violence: Not on file    Review of  Systems:  All other review of systems negative except as mentioned in the HPI.  Physical Exam: Vital signs in last 24 hours: Blood Pressure (Abnormal) 160/80   Pulse 86   Temperature (Abnormal) 97.3 F (36.3 C)   Respiration 15   Height '5\' 1"'$  (1.549 m)   Weight 213 lb (96.6 kg)   Oxygen Saturation 99%   Body Mass Index 40.25 kg/m  General:   Alert, NAD Lungs:  Clear .   Heart:  Regular rate and rhythm Abdomen:  Soft, nontender and nondistended. Neuro/Psych:  Alert and cooperative. Normal mood and affect. A and O x 3  Reviewed labs, radiology imaging, old records and pertinent past GI work up  Patient is appropriate for planned procedure(s) and anesthesia in an ambulatory setting   K. Denzil Magnuson , MD [phone]

## 2022-04-11 NOTE — Op Note (Addendum)
[facility] Patient Name: Sandy Ballard Procedure Date: 04/11/2022 3:23 PM MRN: [id number] Endoscopist: Mauri Pole , MD, [id number] Age: 66 Referring MD:  Date of Birth: 1956/02/10 Gender: Female Account #: [id number] Procedure:                Colonoscopy Indications:              High risk colon cancer surveillance: Personal                            history of colonic polyps, High risk colon cancer                            surveillance: Personal history of adenoma less than                            10 mm in size Medicines:                Monitored Anesthesia Care Procedure:                Pre-Anesthesia Assessment:                           - Prior to the procedure, a History and Physical                            was performed, and patient medications and                            allergies were reviewed. The patient's tolerance of                            previous anesthesia was also reviewed. The risks                            and benefits of the procedure and the sedation                            options and risks were discussed with the patient.                            All questions were answered, and informed consent                            was obtained. Prior Anticoagulants: The patient has                            taken no anticoagulant or antiplatelet agents. ASA                            Grade Assessment: III - A patient with severe                            systemic disease. After reviewing the risks and  benefits, the patient was deemed in satisfactory                            condition to undergo the procedure.                           After obtaining informed consent, the colonoscope                            was passed under direct vision. Throughout the                            procedure, the patient's blood pressure, pulse, and                            oxygen saturations were monitored  continuously. The                            PCF-HQ190L Colonoscope was introduced through the                            anus and advanced to the the cecum, identified by                            appendiceal orifice and ileocecal valve. The                            colonoscopy was performed without difficulty. The                            patient tolerated the procedure well. Scope In: 3:52:59 PM Scope Out: 4:08:20 PM Scope Withdrawal Time: 0 hours 11 minutes 6 seconds  Total Procedure Duration: 0 hours 15 minutes 21 seconds  Findings:                 The perianal and digital rectal examinations were                            normal.                           A 1 mm polyp was found in the transverse colon. The                            polyp was sessile. The polyp was removed with a                            cold biopsy forceps. Resection and retrieval were                            complete.                           Scattered small-mouthed diverticula were found in  the sigmoid colon and descending colon.                           Non-bleeding external and internal hemorrhoids were                            found during retroflexion. The hemorrhoids were                            medium-sized. Complications:            No immediate complications. Estimated Blood Loss:     Estimated blood loss was minimal. Impression:               - One 1 mm polyp in the transverse colon, removed                            with a cold biopsy forceps. Resected and retrieved.                           - Diverticulosis in the sigmoid colon and in the                            descending colon.                           - Non-bleeding external and internal hemorrhoids. Recommendation:           - Patient has a contact number available for                            emergencies. The signs and symptoms of potential                            delayed complications were  discussed with the                            patient. Return to normal activities tomorrow.                            Written discharge instructions were provided to the                            patient.                           - Resume previous diet.                           - Continue present medications.                           - Await pathology results.                           - Repeat colonoscopy in 5-10 years for surveillance  based on pathology results.                           - Return to GI clinic at the next available                            appointment for hemorrhoidal band ligation. Mauri Pole, MD 04/11/2022 4:13:49 PM This report has been signed electronically.

## 2022-04-11 NOTE — Progress Notes (Unsigned)
Report to PACU, RN, vss, BBS= Clear.  

## 2022-04-12 ENCOUNTER — Telehealth: Payer: Self-pay

## 2022-04-12 ENCOUNTER — Encounter: Payer: Self-pay | Admitting: Gastroenterology

## 2022-04-12 NOTE — Telephone Encounter (Signed)
Attempted f/u call. VM Box not setup.

## 2022-04-24 ENCOUNTER — Telehealth: Payer: Self-pay | Admitting: Gastroenterology

## 2022-04-24 NOTE — Telephone Encounter (Signed)
Patient is calling requesting results from recent procedure. Please advise

## 2022-05-03 ENCOUNTER — Encounter: Payer: Self-pay | Admitting: Gastroenterology

## 2022-05-29 NOTE — Progress Notes (Deleted)
Cardiology Office Note   Date:  05/29/2022   ID:  Ballard, Sandy August 21, 1955, MRN [id number]  PCP:  Sandy Mater, MD  Cardiologist:   Sandy Congleton Martinique, MD   No chief complaint on file.     History of Present Illness: Sandy Ballard is a 67 y.o. female who is seen for follow up. She has a history of DM, HTN and thyroid disease. Also HLD. She was previously seen in 2015 by cardiology with [facility]. Myoview study was normal. Echo showed moderate LVH with normal EF. Normal valves. Seen last year for symptoms of exertional fatigue and profuse sweating. Coronary CTA showed minimal nonobstructive CAD.    When seen today she complains of profuse sweating and fatigue with any exertion. She sometimes feels SOB. She has severe reflux symptoms. States she has chest pain all the time. Seen recently by Dr Sandy Ballard and plans for EGD in May. Is being considered for possible gastric bypass. Also complain of left calf pain with exertion. Notes DM has been difficult to control but is improved from A1c 12.5%. Activity is also limited by fibromyalgia.   Past Medical History:  Diagnosis Date   Adenomatous colon polyp    Allergy    hayfever   Anxiety    Arthritis    Asthma    Biliary dyskinesia    Blood transfusion without reported diagnosis    Clotting disorder (Sandy Ballard)    Diabetes (Sandy Ballard)    Duodenal ulcer    Duodenal ulcer    DVT (deep venous thrombosis) (HCC)    1989,leg left   Fibromyalgia    GERD (gastroesophageal reflux disease)    H. pylori infection    Hiatal hernia    Hyperlipidemia    Hyperplastic colon polyp    Hypertension    IBS (irritable bowel syndrome)    Migraine    Thyroid disease     Past Surgical History:  Procedure Laterality Date   ABDOMINAL HYSTERECTOMY     CHOLECYSTECTOMY     COLONOSCOPY     LAPAROSCOPIC ABDOMINAL EXPLORATION     SIGMOIDOSCOPY     THYROID SURGERY     TONSILLECTOMY     UPPER GASTROINTESTINAL ENDOSCOPY       Current Outpatient  Medications  Medication Sig Dispense Refill   albuterol (PROVENTIL HFA;VENTOLIN HFA) 108 (90 Base) MCG/ACT inhaler Inhale 1 puff into the lungs every 4 (four) hours as needed.     albuterol (PROVENTIL) (2.5 MG/3ML) 0.083% nebulizer solution Inhale 2.5 mg into the lungs every 6 (six) hours as needed.     amLODipine (NORVASC) 5 MG tablet Take 1 tablet by mouth daily.     aspirin 81 MG chewable tablet Chew 1 tablet by mouth daily.     atorvastatin (LIPITOR) 10 MG tablet Take 1 tablet (10 mg total) by mouth daily. (Patient not taking: Reported on 04/11/2022) 90 tablet 3   budesonide-formoterol (SYMBICORT) 160-4.5 MCG/ACT inhaler Inhale 2 puffs into the lungs 2 (two) times daily.     carvedilol (COREG) 25 MG tablet Take 1 tablet by mouth daily. Take 0.5 mg daily     cyclobenzaprine (FLEXERIL) 10 MG tablet Take 1 tablet by mouth 3 (three) times daily.     fluconazole (DIFLUCAN) 100 MG tablet Take by mouth. (Patient not taking: Reported on 83/05/5174)     folic acid (FOLVITE) 1 MG tablet Take 1 tablet by mouth daily.     gabapentin (NEURONTIN) 400 MG capsule Take 1 capsule by mouth 3 (three)  times daily.     hydrochlorothiazide (HYDRODIURIL) 25 MG tablet Take 1 tablet by mouth daily.     hyoscyamine (LEVSIN, ANASPAZ) 0.125 MG tablet TAKE ONE TABLET BY MOUTH EVERY 6 HOURS AS NEEDED 30 tablet 2   levothyroxine (SYNTHROID, LEVOTHROID) 50 MCG tablet Take 1 tablet by mouth daily.     lidocaine (XYLOCAINE) 2 % solution Use as directed 15 mLs in the mouth or throat every 3 (three) hours as needed for mouth pain. 100 mL 0   pantoprazole (PROTONIX) 40 MG tablet Take 1 tablet (40 mg total) by mouth 2 (two) times daily. 60 tablet 2   potassium chloride SA (K-DUR,KLOR-CON) 20 MEQ tablet Take 1 tablet by mouth daily.     promethazine (PHENERGAN) 25 MG tablet Take 0.5 tablets (12.5 mg total) by mouth every 6 (six) hours as needed for nausea or vomiting. 15 tablet 0   sucralfate (CARAFATE) 1 g tablet Take 1 tablet (1 g  total) by mouth 2 (two) times daily. 60 tablet 3   VICTOZA 18 MG/3ML SOPN Inject into the skin.     Vitamin D, Ergocalciferol, (DRISDOL) 50000 units CAPS capsule Take 1 capsule by mouth once a week.     No current facility-administered medications for this visit.    Allergies:   Lisinopril, Ciprofloxacin, Nitrofurantoin, Erythromycin, and Tetracycline    Social History:  The patient  reports that she quit smoking about 8 years ago. Her smoking use included cigarettes. She has never been exposed to tobacco smoke. She has never used smokeless tobacco. She reports that she does not drink alcohol and does not use drugs.   Family History:  The patient's family history includes Breast cancer in her mother; Diabetes in her mother.    ROS:  Please see the history of present illness.   Otherwise, review of systems are positive for none.   All other systems are reviewed and negative.    PHYSICAL EXAM: VS:  There were no vitals taken for this visit. , BMI There is no height or weight on file to calculate BMI. GEN: Well nourished, obese BF  in no acute distress HEENT: normal Neck: no JVD, carotid bruits, or masses Cardiac: RRR; no murmurs, rubs, or gallops,no edema  Respiratory:  clear to auscultation bilaterally, normal work of breathing GI: soft, nontender, nondistended, + BS MS: no deformity or atrophy Skin: warm and dry, no rash Neuro:  Strength and sensation are intact Psych: euthymic mood, full affect   EKG:  EKG is ordered today. The ekg ordered today demonstrates NSR rate 85. ST- T changes c/w inferolateral ischemia. I have personally reviewed and interpreted this study.    Recent Labs: 10/04/2021: Creatinine, Ser 0.70    Lipid Panel No results found for: "CHOL", "TRIG", "HDL", "CHOLHDL", "VLDL", "LDLCALC", "LDLDIRECT"   Dated 09/30/20: cholesterol 220, triglycerides 116, HDL 62, LDL 135. Dated 07/19/21: AST 74, ALT 73. CMET and CBC normal.   Wt Readings from Last 3 Encounters:   04/11/22 213 lb (96.6 kg)  04/02/22 213 lb (96.6 kg)  09/20/21 229 lb 12.8 oz (104.2 kg)      Other studies Reviewed: Additional studies/ records that were reviewed today include:   LEXISCAN NUCLEAR STRESS TEST  PROCEDURE DATE: 03/11/2014   Referring  Provider: Ginette Otto  Ordering Provider: Kizzie Ide   Pretest Symptoms: no symptoms (c.o lt sub mamm pain x 7 months).   Pharmacologic stress testing was performed.  The patient received  a 10-second intravenous injection of 0.4 mg of Lexiscan  (  regadenoson), followed by a 5 mL saline flush and the sestamibi  injection.   Comment:  (lungs clear Debbe Bales NP)   Low level exercise performed:  Low level exercise: leg kicks    Blood Pressure/Heart Rate:  Baseline Heart Rate: 76BPM.    During the regadenoson stress test the Heart Rate increased to:  105BPM  Resting Blood Pressure: 154/85 mmHg    Blood Pressure changed to (low): 159/59 mmHg  Blood Pressure changed to (high): 175/59 mmHg   Discontinuation of Stress Test:  The stress test was stopped due to: achieved protocol   Symptoms:  Anginal stress symptoms: no chest pain    Other Symptoms: lightheadedness, headache, nausea  Symptoms resolved with: rest   Myocardial perfusion imaging performed  Imaging protocol: 1-day rest/stress   Images were aquired at: rest, stress  Injection dose of 62mc-sestamibi (mCi) at rest: 13.1 mCi  IV access site (rest): left antecubital   Immediately following injection of regadenoson and saline flush:  Injection dose of 921m-sestamibi (mCi) at stress: 40.2 mCi    IV access site (stress): left antecubital   Attenuation correction is not available in [facility].  Images Obtained: Gated stress (non gated rest).    Rest Injection  Name: kb  Date: 03/11/14  Time: 0830 Rest images obtained  Date: 03/11/14  Time: 0915 Procedure nurse  Name: bf  title: np  Stress Injection  Name: kb  Date:  03/11/14  Time: 1000 Stress images obtained  Date: 03/11/14  Time: 1105 Cardiac Tech  Name: kb  title: cnmt   EKG Findings: REST: Normal sinus rhythm. Non specific ST-T wave  changes.    STRESS: Non specific ST-T wave changes, non diagnostic.   IMPRESSION:  Normal study.  Inferoseptal hot spot; consider left ventricular hypertrophy.  Visually EF > 65%.( calculated at 50%).   Interpreted and approved by: Dr. JaOswaldo Done Date:  03-11-2014  Echo 03/11/14:Summary   1. Overall left ventricular ejection fraction is estimated at 60 to 65%.   2. Normal global left ventricular systolic function.   3. (Grade 1) Mildly abnormal left ventricular diastolic filling.   4. Moderate concentric left ventricular hypertrophy.   5. Mild mitral annular calcification.   6. Elevated left ventricular end diastolic pressure by tissue Doppler.   7. No intracardiac thrombi, mass or vegetations.   Coronary CTA 10/04/21:  CLINICAL DATA:  48F with chest pain   EXAM: Cardiac/Coronary CTA   TECHNIQUE: The patient was scanned on a [employer]  FINDINGS: A 100 kV prospective scan was triggered in the descending thoracic aorta at 111 HU's. Axial non-contrast 3 mm slices were carried out through the heart. The data set was analyzed on a dedicated work station and scored using the [employer] rotation speed was 250 msecs and collimation was .6 mm. 0.8 mg of sl NTG was given. The 3D data set was reconstructed in 5% intervals of the 35-75 % of the R-R cycle. Phases were analyzed on a dedicated work station using MPR, MIP and VRT modes. The patient received 80 cc of contrast.   Coronary Arteries:  Normal coronary origin.  Left dominance.   RCA is small and nondominant   Left main is a large artery that gives rise to LAD and LCX arteries.   LAD is a large vessel that has no plaque. Calcified plaque in the proximal LAD causes 0-24% stenosis   LCX is a non-dominant artery  that gives  rise to one large OM1 branch. Noncalcified plaque in the distal LCX causes 0-24% stenosis   Other findings:   Left Ventricle: Normal size   Left Atrium: Mild enlargement   Pulmonary Veins: Normal configuration   Right Ventricle: Normal size   Right Atrium: Normal size   Cardiac valves: Mitral annular calcification   Thoracic aorta: Normal size   Pulmonary Arteries: Normal size   Systemic Veins: Normal drainage   Pericardium: Normal thickness   IMPRESSION: 1. Coronary calcium score of 40. This was 81st percentile for age and sex matched control.   2. Normal coronary origin with left dominance.   3. Nonobstructive CAD with calcified plaque in proximal LAD and noncalcified plaque in distal LCX causing minimal (0-24%) stenosis   CAD-RADS 1. Minimal non-obstructive CAD (0-24%). Consider non-atherosclerotic causes of chest pain. Consider preventive therapy and risk factor modification.     Electronically Signed   By: Oswaldo Milian M.D.   On: 10/04/2021 12:47         ASSESSMENT AND PLAN:  1.  Patient with symptoms of exertional fatigue and profuse sweating. Minimal nonobstructive CAD by CTA 2. Left calf pain with exertion. Will check LE arterial dopplers 3. DM type 2 on insulin 4. HTN 5. HLD. Will update lipid panel. Will likely need to go on statin therapy 6. Elevated transaminases. Almost certainly due to fatty liver. Per GI 7. GERD. Plans for upper EGD.    Current medicines are reviewed at length with the patient today.  The patient does not have concerns regarding medicines.  The following changes have been made:  no change  Labs/ tests ordered today include:   No orders of the defined types were placed in this encounter.        Disposition:   FU with me after above studies    Signed, Hailynn Slovacek Martinique, MD  05/29/2022 9:32 AM    [facility] [address] [place], [place], [address] Phone [phone], Fax  [phone]

## 2022-06-04 ENCOUNTER — Ambulatory Visit: Payer: Medicare HMO | Admitting: Cardiology

## 2022-06-05 ENCOUNTER — Encounter: Payer: Medicare HMO | Admitting: Gastroenterology

## 2022-08-21 ENCOUNTER — Encounter: Payer: Medicare HMO | Admitting: Gastroenterology

## 2022-09-29 NOTE — Progress Notes (Deleted)
Cardiology Office Note   Date:  09/29/2022   ID:  Sandy Ballard, DOB 1955/08/10, MRN [id number]  PCP:  Christena Flake, MD  Cardiologist:   Chandni Gagan [place], MD   No chief complaint on file.     History of Present Illness: Sandy Ballard is a 67 y.o. female who is seen  for follow up CAD.  She has a history of DM, HTN and thyroid disease. Also HLD. She was previously seen in 2015 by cardiology with [facility]. Myoview study was normal. Echo showed moderate LVH with normal EF. Normal valves.   When seen last year she complained of profuse sweating and fatigue with any exertion. She sometimes feels SOB. She has severe reflux symptoms. States she has chest pain all the time. Seen recently by Dr Lavon Paganini and plans for EGD in May. Is being considered for possible gastric bypass. Also complain of left calf pain with exertion. Notes DM has been difficult to control but is improved from A1c 12.5%. Activity is also limited by fibromyalgia.   We performed coronary CTA in May 2023 that showed mild nonobstructive CAD. Risk factor modification recommended. Because she was having calf pain she also had LE arterial dopplers that were normal. She did have pan endoscopy in November. Few small colon polyps. Significant esophagitis and inflammation.   Past Medical History:  Diagnosis Date   Adenomatous colon polyp    Allergy    hayfever   Anxiety    Arthritis    Asthma    Biliary dyskinesia    Blood transfusion without reported diagnosis    Clotting disorder (HCC)    Diabetes (HCC)    Duodenal ulcer    Duodenal ulcer    DVT (deep venous thrombosis) (HCC)    1989,leg left   Fibromyalgia    GERD (gastroesophageal reflux disease)    H. pylori infection    Hiatal hernia    Hyperlipidemia    Hyperplastic colon polyp    Hypertension    IBS (irritable bowel syndrome)    Migraine    Thyroid disease     Past Surgical History:  Procedure Laterality Date   ABDOMINAL HYSTERECTOMY      CHOLECYSTECTOMY     COLONOSCOPY     LAPAROSCOPIC ABDOMINAL EXPLORATION     SIGMOIDOSCOPY     THYROID SURGERY     TONSILLECTOMY     UPPER GASTROINTESTINAL ENDOSCOPY       Current Outpatient Medications  Medication Sig Dispense Refill   albuterol (PROVENTIL HFA;VENTOLIN HFA) 108 (90 Base) MCG/ACT inhaler Inhale 1 puff into the lungs every 4 (four) hours as needed.     albuterol (PROVENTIL) (2.5 MG/3ML) 0.083% nebulizer solution Inhale 2.5 mg into the lungs every 6 (six) hours as needed.     amLODipine (NORVASC) 5 MG tablet Take 1 tablet by mouth daily.     aspirin 81 MG chewable tablet Chew 1 tablet by mouth daily.     atorvastatin (LIPITOR) 10 MG tablet Take 1 tablet (10 mg total) by mouth daily. (Patient not taking: Reported on 04/11/2022) 90 tablet 3   budesonide-formoterol (SYMBICORT) 160-4.5 MCG/ACT inhaler Inhale 2 puffs into the lungs 2 (two) times daily.     carvedilol (COREG) 25 MG tablet Take 1 tablet by mouth daily. Take 0.5 mg daily     cyclobenzaprine (FLEXERIL) 10 MG tablet Take 1 tablet by mouth 3 (three) times daily.     fluconazole (DIFLUCAN) 100 MG tablet Take by mouth. (Patient not taking: Reported  on 04/02/2022)     folic acid (FOLVITE) 1 MG tablet Take 1 tablet by mouth daily.     gabapentin (NEURONTIN) 400 MG capsule Take 1 capsule by mouth 3 (three) times daily.     hydrochlorothiazide (HYDRODIURIL) 25 MG tablet Take 1 tablet by mouth daily.     hyoscyamine (LEVSIN, ANASPAZ) 0.125 MG tablet TAKE ONE TABLET BY MOUTH EVERY 6 HOURS AS NEEDED 30 tablet 2   levothyroxine (SYNTHROID, LEVOTHROID) 50 MCG tablet Take 1 tablet by mouth daily.     lidocaine (XYLOCAINE) 2 % solution Use as directed 15 mLs in the mouth or throat every 3 (three) hours as needed for mouth pain. 100 mL 0   pantoprazole (PROTONIX) 40 MG tablet Take 1 tablet (40 mg total) by mouth 2 (two) times daily. 60 tablet 2   potassium chloride SA (K-DUR,KLOR-CON) 20 MEQ tablet Take 1 tablet by mouth daily.      promethazine (PHENERGAN) 25 MG tablet Take 0.5 tablets (12.5 mg total) by mouth every 6 (six) hours as needed for nausea or vomiting. 15 tablet 0   sucralfate (CARAFATE) 1 g tablet Take 1 tablet (1 g total) by mouth 2 (two) times daily. 60 tablet 3   VICTOZA 18 MG/3ML SOPN Inject into the skin.     Vitamin D, Ergocalciferol, (DRISDOL) 50000 units CAPS capsule Take 1 capsule by mouth once a week.     No current facility-administered medications for this visit.    Allergies:   Lisinopril, Ciprofloxacin, Nitrofurantoin, Erythromycin, and Tetracycline    Social History:  The patient  reports that she quit smoking about 8 years ago. Her smoking use included cigarettes. She has never been exposed to tobacco smoke. She has never used smokeless tobacco. She reports that she does not drink alcohol and does not use drugs.   Family History:  The patient's family history includes Breast cancer in her mother; Diabetes in her mother.    ROS:  Please see the history of present illness.   Otherwise, review of systems are positive for none.   All other systems are reviewed and negative.    PHYSICAL EXAM: VS:  There were no vitals taken for this visit. , BMI There is no height or weight on file to calculate BMI. GEN: Well nourished, obese BF  in no acute distress HEENT: normal Neck: no JVD, carotid bruits, or masses Cardiac: RRR; no murmurs, rubs, or gallops,no edema  Respiratory:  clear to auscultation bilaterally, normal work of breathing GI: soft, nontender, nondistended, + BS MS: no deformity or atrophy Skin: warm and dry, no rash Neuro:  Strength and sensation are intact Psych: euthymic mood, full affect   EKG:  EKG is ordered today. The ekg ordered today demonstrates NSR rate 85. ST- T changes c/w inferolateral ischemia. I have personally reviewed and interpreted this study.    Recent Labs: 10/04/2021: Creatinine, Ser 0.70    Lipid Panel No results found for: "CHOL", "TRIG", "HDL",  "CHOLHDL", "VLDL", "LDLCALC", "LDLDIRECT"   Dated 09/30/20: cholesterol 220, triglycerides 116, HDL 62, LDL 135. Dated 07/19/21: AST 74, ALT 73. CMET and CBC normal.   Wt Readings from Last 3 Encounters:  04/11/22 213 lb (96.6 kg)  04/02/22 213 lb (96.6 kg)  09/20/21 229 lb 12.8 oz (104.2 kg)      Other studies Reviewed: Additional studies/ records that were reviewed today include:   LEXISCAN NUCLEAR STRESS TEST  PROCEDURE DATE: 03/11/2014   Referring  Provider: Tanda Rockers  Ordering Provider: Otilio Miu  Pretest Symptoms: no symptoms (c.o lt sub mamm pain x 7 months).   Pharmacologic stress testing was performed.  The patient received  a 10-second intravenous injection of 0.4 mg of Lexiscan  (regadenoson), followed by a 5 mL saline flush and the sestamibi  injection.   Comment:  (lungs clear Emi Belfast NP)   Low level exercise performed:  Low level exercise: leg kicks    Blood Pressure/Heart Rate:  Baseline Heart Rate: 76BPM.    During the regadenoson stress test the Heart Rate increased to:  105BPM  Resting Blood Pressure: 154/85 mmHg    Blood Pressure changed to (low): 159/59 mmHg  Blood Pressure changed to (high): 175/59 mmHg   Discontinuation of Stress Test:  The stress test was stopped due to: achieved protocol   Symptoms:  Anginal stress symptoms: no chest pain    Other Symptoms: lightheadedness, headache, nausea  Symptoms resolved with: rest   Myocardial perfusion imaging performed  Imaging protocol: 1-day rest/stress   Images were aquired at: rest, stress  Injection dose of 73mTc-sestamibi (mCi) at rest: 13.1 mCi  IV access site (rest): left antecubital   Immediately following injection of regadenoson and saline flush:  Injection dose of 11mTc-sestamibi (mCi) at stress: 40.2 mCi    IV access site (stress): left antecubital   Attenuation correction is not available in [facility]'s nuclear laboratory.  Images Obtained: Gated stress  (non gated rest).    Rest Injection  Name: kb  Date: 03/11/14  Time: 0830 Rest images obtained  Date: 03/11/14  Time: 0915 Procedure nurse  Name: bf  title: np  Stress Injection  Name: kb  Date: 03/11/14  Time: 1000 Stress images obtained  Date: 03/11/14  Time: 1105 Cardiac Tech  Name: kb  title: cnmt   EKG Findings: REST: Normal sinus rhythm. Non specific ST-T wave  changes.    STRESS: Non specific ST-T wave changes, non diagnostic.   IMPRESSION:  Normal study.  Inferoseptal hot spot; consider left ventricular hypertrophy.  Visually EF > 65%.( calculated at 50%).   Interpreted and approved by: Dr. Armen Pickup   Date:  03-11-2014  Echo 03/11/14:Summary   1. Overall left ventricular ejection fraction is estimated at 60 to 65%.   2. Normal global left ventricular systolic function.   3. (Grade 1) Mildly abnormal left ventricular diastolic filling.   4. Moderate concentric left ventricular hypertrophy.   5. Mild mitral annular calcification.   6. Elevated left ventricular end diastolic pressure by tissue Doppler.   7. No intracardiac thrombi, mass or vegetations.    Cardiac/Coronary CTA   TECHNIQUE: The patient was scanned on a [employer].   FINDINGS: A 100 kV prospective scan was triggered in the descending thoracic aorta at 111 HU's. Axial non-contrast 3 mm slices were carried out through the heart. The data set was analyzed on a dedicated work station and scored using the Agatson method. Gantry rotation speed was 250 msecs and collimation was .6 mm. 0.8 mg of sl NTG was given. The 3D data set was reconstructed in 5% intervals of the 35-75 % of the R-R cycle. Phases were analyzed on a dedicated work station using MPR, MIP and VRT modes. The patient received 80 cc of contrast.   Coronary Arteries:  Normal coronary origin.  Left dominance.   RCA is small and nondominant   Left main is a large artery that gives rise to LAD and LCX  arteries.   LAD is a large vessel that  has no plaque. Calcified plaque in the proximal LAD causes 0-24% stenosis   LCX is a non-dominant artery that gives rise to one large OM1 branch. Noncalcified plaque in the distal LCX causes 0-24% stenosis   Other findings:   Left Ventricle: Normal size   Left Atrium: Mild enlargement   Pulmonary Veins: Normal configuration   Right Ventricle: Normal size   Right Atrium: Normal size   Cardiac valves: Mitral annular calcification   Thoracic aorta: Normal size   Pulmonary Arteries: Normal size   Systemic Veins: Normal drainage   Pericardium: Normal thickness   IMPRESSION: 1. Coronary calcium score of 40. This was 81st percentile for age and sex matched control.   2. Normal coronary origin with left dominance.   3. Nonobstructive CAD with calcified plaque in proximal LAD and noncalcified plaque in distal LCX causing minimal (0-24%) stenosis   CAD-RADS 1. Minimal non-obstructive CAD (0-24%). Consider non-atherosclerotic causes of chest pain. Consider preventive therapy and risk factor modification.     Electronically Signed   By: Epifanio Lesches M.D.   On: 10/04/2021 12:47d  ASSESSMENT AND PLAN:  1.  Patient with symptoms of exertional fatigue and profuse sweating. Also constant chest pain. Multiple cardiac risk factors including DM, HTN, HLD, and obesity. Recommend evaluation with coronary CTA. Last ischemic evaluation in 2015. Will update BMET.  2. Left calf pain with exertion. Will check LE arterial dopplers 3. DM type 2 on insulin 4. HTN 5. HLD. Will update lipid panel. Will likely need to go on statin therapy 6. Elevated transaminases. Almost certainly due to fatty liver. Per GI 7. GERD. Plans for upper EGD.    Current medicines are reviewed at length with the patient today.  The patient does not have concerns regarding medicines.  The following changes have been made:  no change  Labs/ tests ordered today  include:   No orders of the defined types were placed in this encounter.        Disposition:   FU with me after above studies    Signed, Tamanika Heiney [place], MD  09/29/2022 5:18 PM    [facility] [address], [place], [place], [address] Phone [phone], Fax [phone]

## 2022-10-05 ENCOUNTER — Ambulatory Visit: Payer: Medicare HMO | Attending: Cardiology | Admitting: Cardiology

## 2022-10-12 ENCOUNTER — Encounter: Payer: Medicare HMO | Admitting: Gastroenterology

## 2023-02-13 ENCOUNTER — Encounter: Payer: Medicare HMO | Admitting: Gastroenterology

## 2023-06-26 ENCOUNTER — Encounter: Payer: Medicare HMO | Admitting: Gastroenterology

## 2023-08-30 ENCOUNTER — Encounter: Payer: Medicare HMO | Admitting: Gastroenterology

## 2023-11-12 ENCOUNTER — Ambulatory Visit: Admitting: Gastroenterology

## 2023-12-18 IMAGING — CT CT HEART MORP W/ CTA COR W/ SCORE W/ CA W/CM &/OR W/O CM
1 series · 8 of 10 positions shown, 10 images · non-contrast
Comparison: None Available.
COMPARISON: None Available.

Addendum:
EXAM:
OVER-READ INTERPRETATION  CT CHEST

The following report is an over-read performed by radiologist Dr.
Ashikin Carol [[email or web address]] on 10/04/2021. This
over-read does not include interpretation of cardiac or coronary
anatomy or pathology. The coronary calcium score/coronary CTA
interpretation by the cardiologist is attached.
CLINICAL DATA: 65F with chest pain
Cardiac/Coronary CTA
TECHNIQUE: The patient was scanned on a [employer] Force scanner.

[Series 4092: coronaries · 8 of 10 slices shown, 10 images]
[im 2/10  vessel]
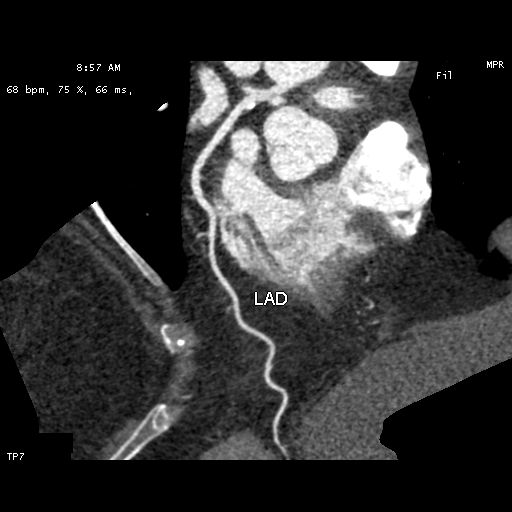
[im 2/10  lung]
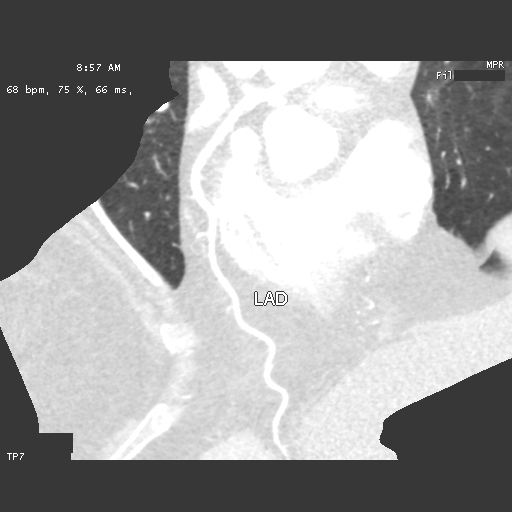
[im 3/10  vessel]
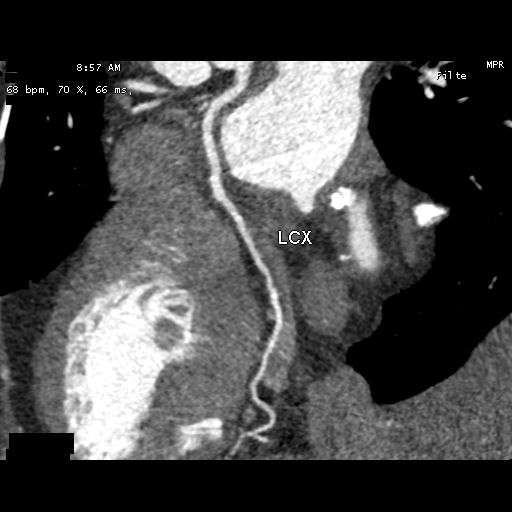
[im 4/10  vessel]
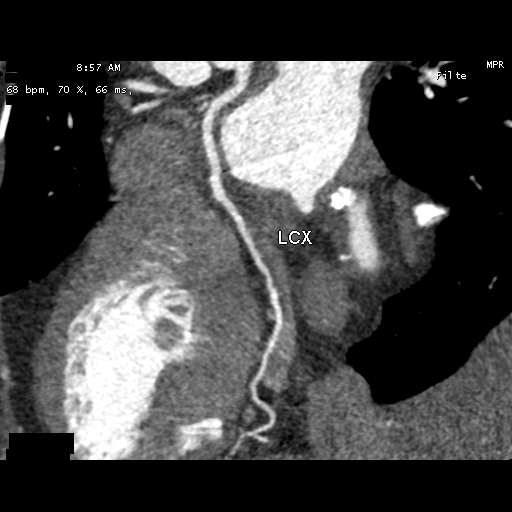
[im 5/10  vessel]
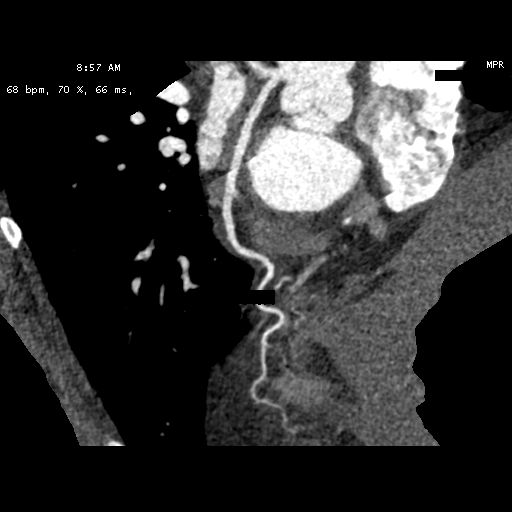
[im 6/10  vessel]
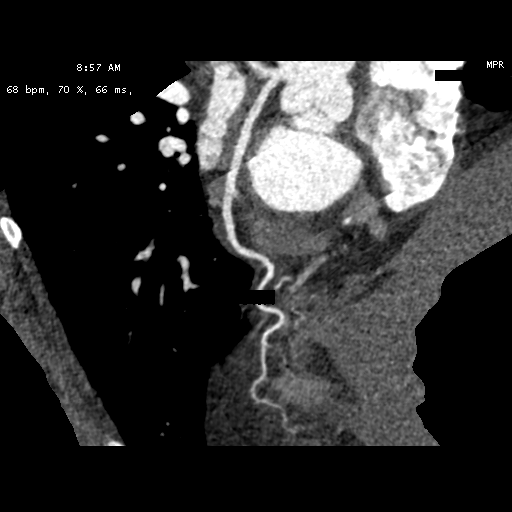
[im 6/10  lung]
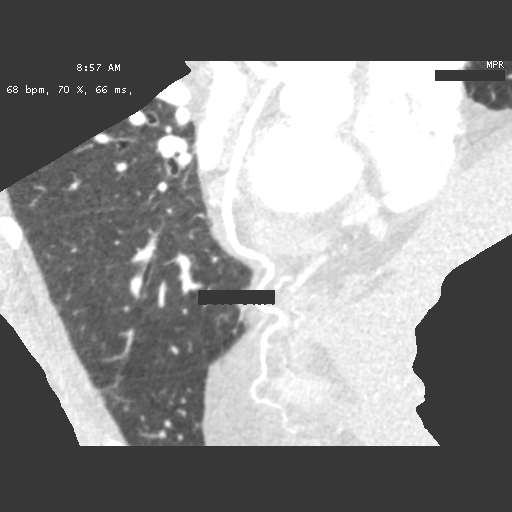
[im 7/10  vessel]
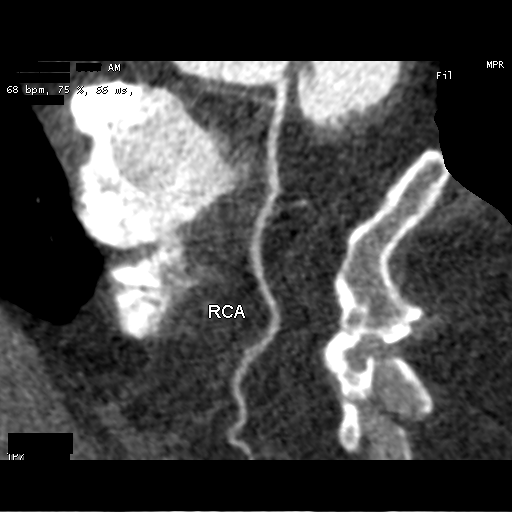
[im 8/10  vessel]
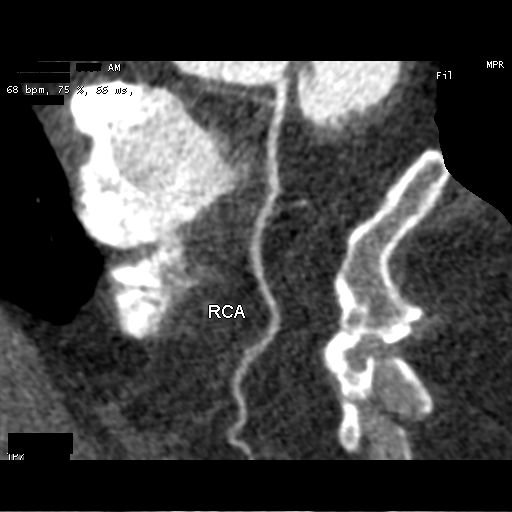
[im 9/10  vessel]
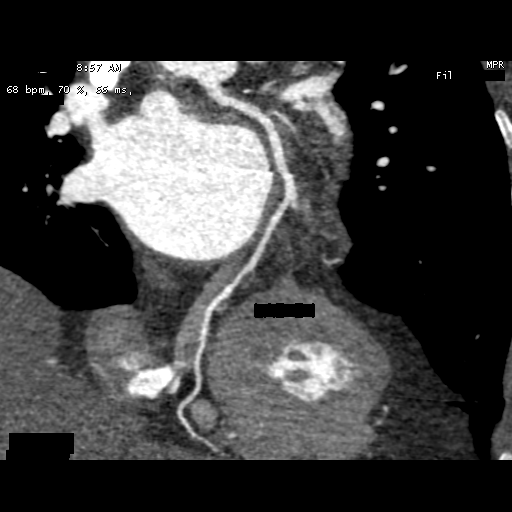

[8 of 10 positions shown; findings below may reference images not displayed]

FINDINGS: Atherosclerotic calcifications in the thoracic aorta. Within the
visualized portions of the thorax there are no suspicious appearing
pulmonary nodules or masses, there is no acute consolidative
airspace disease, no pleural effusions, no pneumothorax and no
lymphadenopathy. Visualized portions of the upper abdomen are
unremarkable. There are no aggressive appearing lytic or blastic
lesions noted in the visualized portions of the skeleton.
IMPRESSION: 1.  Aortic Atherosclerosis (GAWEC-8IZ.Z).
FINDINGS: A 100 kV prospective scan was triggered in the descending thoracic
aorta at 111 HU's. Axial non-contrast 3 mm slices were carried out
through the heart. The data set was analyzed on a dedicated work
station and scored using the Agatson method. Gantry rotation speed
was 250 msecs and collimation was .6 mm. 0.8 mg of sl NTG was given.
The 3D data set was reconstructed in 5% intervals of the 35-75 % of
the R-R cycle. Phases were analyzed on a dedicated work station
using MPR, MIP and VRT modes. The patient received 80 cc of
contrast.

Coronary Arteries:  Normal coronary origin.  Left dominance.

RCA is small and nondominant

Left main is a large artery that gives rise to LAD and LCX arteries.

LAD is a large vessel that has no plaque. Calcified plaque in the
proximal LAD causes 0-24% stenosis

LCX is a non-dominant artery that gives rise to one large OM1
branch. Noncalcified plaque in the distal LCX causes 0-24% stenosis

Other findings:

Left Ventricle: Normal size

Left Atrium: Mild enlargement

Pulmonary Veins: Normal configuration

Right Ventricle: Normal size

Right Atrium: Normal size

Cardiac valves: Mitral annular calcification

Thoracic aorta: Normal size

Pulmonary Arteries: Normal size

Systemic Veins: Normal drainage

Pericardium: Normal thickness
IMPRESSION: 1. Coronary calcium score of 40. This was 81st percentile for age
and sex matched control.

2. Normal coronary origin with left dominance.

3. Nonobstructive CAD with calcified plaque in proximal LAD and
noncalcified plaque in distal LCX causing minimal (0-24%) stenosis

CAD-RADS 1. Minimal non-obstructive CAD (0-24%). Consider
non-atherosclerotic causes of chest pain. Consider preventive
therapy and risk factor modification.

*** End of Addendum ***
EXAM:
OVER-READ INTERPRETATION  CT CHEST

The following report is an over-read performed by radiologist Dr.
Ashikin Carol [[email or web address]] on 10/04/2021. This
over-read does not include interpretation of cardiac or coronary
anatomy or pathology. The coronary calcium score/coronary CTA
interpretation by the cardiologist is attached.
FINDINGS: Atherosclerotic calcifications in the thoracic aorta. Within the
visualized portions of the thorax there are no suspicious appearing
pulmonary nodules or masses, there is no acute consolidative
airspace disease, no pleural effusions, no pneumothorax and no
lymphadenopathy. Visualized portions of the upper abdomen are
unremarkable. There are no aggressive appearing lytic or blastic
lesions noted in the visualized portions of the skeleton.
IMPRESSION: 1.  Aortic Atherosclerosis (GAWEC-8IZ.Z).

## 2024-01-22 ENCOUNTER — Encounter: Admitting: Gastroenterology

## 2024-05-13 NOTE — Telephone Encounter (Signed)
 Patient notified and verbalized understanding.

## 2024-05-18 ENCOUNTER — Ambulatory Visit: Admitting: Obstetrics and Gynecology

## 2024-05-18 ENCOUNTER — Encounter: Payer: Self-pay | Admitting: Obstetrics and Gynecology

## 2024-05-18 VITALS — BP 156/82 | HR 85 | Ht 58.27 in | Wt 217.0 lb

## 2024-05-18 DIAGNOSIS — K5904 Chronic idiopathic constipation: Secondary | ICD-10-CM

## 2024-05-18 DIAGNOSIS — R29898 Other symptoms and signs involving the musculoskeletal system: Secondary | ICD-10-CM | POA: Diagnosis not present

## 2024-05-18 DIAGNOSIS — M62838 Other muscle spasm: Secondary | ICD-10-CM | POA: Diagnosis not present

## 2024-05-18 DIAGNOSIS — R350 Frequency of micturition: Secondary | ICD-10-CM | POA: Diagnosis not present

## 2024-05-18 DIAGNOSIS — Z6841 Body Mass Index (BMI) 40.0 and over, adult: Secondary | ICD-10-CM | POA: Insufficient documentation

## 2024-05-18 DIAGNOSIS — N393 Stress incontinence (female) (male): Secondary | ICD-10-CM | POA: Insufficient documentation

## 2024-05-18 LAB — POCT URINALYSIS DIP (CLINITEK)
Bilirubin, UA: NEGATIVE
Blood, UA: NEGATIVE
Glucose, UA: 500 mg/dL — AB
Ketones, POC UA: NEGATIVE mg/dL
Leukocytes, UA: NEGATIVE
Nitrite, UA: NEGATIVE
POC PROTEIN,UA: NEGATIVE
Spec Grav, UA: 1.02
Urobilinogen, UA: 0.2 U/dL
pH, UA: 7.5

## 2024-05-18 MED ORDER — CYCLOBENZAPRINE HCL 10 MG PO TABS: 10.0000 mg | ORAL_TABLET | Freq: Three times a day (TID) | ORAL | 2 refills | Status: AC

## 2024-05-18 NOTE — Assessment & Plan Note (Signed)
 For constipation, we reviewed the importance of a better bowel regimen.  We also discussed the importance of avoiding chronic straining, as it can exacerbate her pelvic floor symptoms. We discussed initiating therapy with increasing fluid intake, fiber supplementation, and laxatives such as miralax daily

## 2024-05-18 NOTE — Progress Notes (Signed)
 " New Patient Evaluation and Consultation  Referring Provider: Anitra Delon ORN, NP PCP: Hanna Fallow, MD Date of Service: 05/18/2024  SUBJECTIVE Chief Complaint: New Patient (Initial Visit) Sandy Ballard is a 68 y.o. female here today for pelvic pain )  History of Present Illness: Sandy Ballard is a 68 y.o. Black or African-American female seen in consultation at the request of NP Delon Anitra for evaluation of pelvic pain.     Urinary Symptoms: Leaks urine with cough/ sneeze, laughing, exercise, lifting, with a full bladder, with movement to the bathroom, and with urgency Leaks several times per day. Mostly with SUI Pad use: 2 liners/ mini-pads per day.   Patient is not bothered by UI symptoms.  Day time voids 4-6.  Nocturia: 2-3 times per night to void. Voiding dysfunction:  does not empty bladder well.  Patient does not use a catheter to empty bladder.  When urinating, patient feels a weak stream, difficulty starting urine stream, dribbling after finishing, the need to urinate multiple times in a row, and to push on her belly or vagina to empty bladder Drinks: mushroom rise coffee, a gallon water per day (fills throughout the night) Has had two sleep studies- no sleep apnea  UTIs: 4-6 UTI's in the last year.  Started vaginal estrogen cream in September, using it 2 times per week.  Denies history of blood in urine and kidney or bladder stones   Pelvic Organ Prolapse Symptoms:                  Patient Admits to a feeling of a bulge the vaginal area. It has been present for 1 year.  Patient Denies seeing a bulge.  This bulge is not bothersome.  Bowel Symptom: Bowel movements: every 3 days Has been constipated since starting trulicity Stool consistency: hard, soft , or loose Straining: yes.  Splinting: yes, sometimes Incomplete evacuation: yes.  Patient Denies accidental bowel leakage / fecal incontinence Bowel regimen: none- sometimes takes metamucil,  sometimes takes a natural laxative  HM Colonoscopy          Upcoming     Colonoscopy (Every 7 Years) Next due on 04/11/2029    04/11/2022  COLONOSCOPY  Only the first 1 history entries have been loaded, but more history exists.               Sexual Function Sexually active: no.    Pelvic Pain Admits to pelvic pain- esp on the right side Location: all the time, esp when bladder is full Pain occurs: before she empties bladder Prior pain treatment: none Improved by: nothing Worsened by: nothing Sometimes sees blood with wiping- coming from the vagina  Pt reports that she has hip and back pain due to arthritis. Cannot stand for long periods of time. She is taking flexeril  and gabapentin. Recently has also had periods where she feels she is dragging her right leg.  She has seen the urologist in [place] and had bladder instillations for interstitial cystitis. She did not feel that this helped.   Past Medical History:  Past Medical History:  Diagnosis Date   Adenomatous colon polyp    Allergy    hayfever   Anxiety    Arthritis    Asthma    Biliary dyskinesia    Blood transfusion without reported diagnosis    Clotting disorder    Diabetes (HCC)    Duodenal ulcer    Duodenal ulcer    DVT (deep venous thrombosis) (HCC)  1989,leg left   Fibromyalgia    GERD (gastroesophageal reflux disease)    H. pylori infection    Hiatal hernia    Hyperlipidemia    Hyperplastic colon polyp    Hypertension    IBS (irritable bowel syndrome)    Migraine    Thyroid disease   Hgb A1c 9.5%   Past Surgical History:   Past Surgical History:  Procedure Laterality Date   ABDOMINAL HYSTERECTOMY     with BSO   CHOLECYSTECTOMY     COLONOSCOPY     LAPAROSCOPIC ABDOMINAL EXPLORATION     SIGMOIDOSCOPY     THYROID SURGERY     TONSILLECTOMY     UPPER GASTROINTESTINAL ENDOSCOPY       Past OB/GYN History: OB History  Gravida Para Term Preterm AB Living  2 1 1  1 1   SAB  IAB Ectopic Multiple Live Births  1    1    # Outcome Date GA Lbr Len/2nd Weight Sex Type Anes PTL Lv  2 Term     F Vag-Spont   LIV  1 SAB             S/p hysterectomy   Medications: Patient has a current medication list which includes the following prescription(s): albuterol, albuterol, amlodipine, aspirin, carvedilol, dulaglutide, fluticasone-umeclidin-vilant, folic acid, gabapentin, hydrochlorothiazide, hyoscyamine , levothyroxine, pantoprazole , potassium chloride sa, promethazine , sucralfate , vitamin d (ergocalciferol), atorvastatin , budesonide-formoterol, cyclobenzaprine , fluconazole, lidocaine , and victoza.   Allergies: Patient is allergic to lisinopril, ciprofloxacin, nitrofurantoin, erythromycin, and tetracycline.   Social History: Social History[1]  Relationship status: married Patient lives with spouse.   Patient is not employed- retired. Regular exercise: No History of abuse: No  Family History:   Family History  Problem Relation Age of Onset   Breast cancer Mother    Diabetes Mother    Colon cancer Neg Hx    Stomach cancer Neg Hx    Esophageal cancer Neg Hx    Colon polyps Neg Hx    Crohn's disease Neg Hx    Rectal cancer Neg Hx    Ulcerative colitis Neg Hx    Uterine cancer Neg Hx    Bladder Cancer Neg Hx    Renal cancer Neg Hx      Review of Systems: Review of Systems  Constitutional:  Positive for malaise/fatigue and weight loss. Negative for fever.  Respiratory:  Positive for cough and wheezing. Negative for shortness of breath.   Cardiovascular:  Negative for chest pain, palpitations and leg swelling.  Gastrointestinal:  Positive for abdominal pain. Negative for blood in stool.  Genitourinary:  Negative for dysuria.  Musculoskeletal:  Positive for myalgias.  Skin:  Positive for rash.  Neurological:  Negative for dizziness and headaches.  Endo/Heme/Allergies:  Does not bruise/bleed easily.       + hot flashes  Psychiatric/Behavioral:  Negative for  depression. The patient is not nervous/anxious.      OBJECTIVE Physical Exam: Vitals:   05/18/24 1325  BP: (!) 156/82  Pulse: 85  Weight: 217 lb (98.4 kg)  Height: 4' 10.27 (1.48 m)    Physical Exam Vitals reviewed. Exam conducted with a chaperone present.  Constitutional:      General: She is not in acute distress.    Appearance: She is obese.  Pulmonary:     Effort: Pulmonary effort is normal.  Abdominal:     General: There is no distension.     Palpations: Abdomen is soft.     Tenderness: There is abdominal tenderness. There  is no rebound.     Comments: Tenderness in right upper and lower quadrant, no suprapubic tenderness  Musculoskeletal:        General: No swelling. Normal range of motion.  Skin:    General: Skin is warm and dry.     Findings: No rash.  Neurological:     Mental Status: She is alert and oriented to person, place, and time.  Psychiatric:        Mood and Affect: Mood normal.        Behavior: Behavior normal.      GU / Detailed Urogynecologic Evaluation:  Pelvic Exam: Normal external female genitalia; Bartholin's and Skene's glands normal in appearance; urethral meatus normal in appearance, no urethral masses or discharge.   CST: negative  s/p hysterectomy: Speculum exam reveals normal vaginal mucosa with  atrophy and normal vaginal cuff.  Adnexa no mass, fullness, tenderness.    Pelvic floor strength I/V  Pelvic floor musculature: Right levator tender, Right obturator tender, Left levator tender, Left obturator non-tender  POP-Q:   POP-Q  -2                                            Aa   -2                                           Ba  -9                                              C   2.5                                            Gh  6                                            Pb  9                                            tvl   -2                                            Ap  -2                                             Bp  D      Rectal Exam:  deferred  Post-Void Residual (PVR) by Bladder Scan: In order to evaluate bladder emptying, we discussed obtaining a postvoid residual and patient agreed to this procedure.  Procedure: The ultrasound unit was placed on the patient's abdomen in the suprapubic region after the patient had voided.    Post Void Residual - 05/18/24 1348       Post Void Residual   Post Void Residual 13 mL           Laboratory Results: Lab Results  Component Value Date   COLORU yellow 05/18/2024   CLARITYU clear 05/18/2024   GLUCOSEUR =500 (A) 05/18/2024   BILIRUBINUR negative 05/18/2024   SPECGRAV 1.020 05/18/2024   RBCUR negative 05/18/2024   PHUR 7.5 05/18/2024   UROBILINOGEN 0.2 05/18/2024   LEUKOCYTESUR Negative 05/18/2024    Lab Results  Component Value Date   CREATININE 0.70 10/04/2021   CREATININE 0.6 02/02/2010    No results found for: HGBA1C  Lab Results  Component Value Date   HGB 12.7 02/02/2010     ASSESSMENT AND PLAN Ms. Fannin is a 68 y.o. with:  1. Levator spasm   2. Urinary frequency   3. SUI (stress urinary incontinence, female)   4. BMI 40.0-44.9, adult (HCC)   5. Right leg weakness   6. Chronic idiopathic constipation     Levator spasm Assessment & Plan: - The origin of pelvic floor muscle spasm can be multifactorial, including primary, reactive to a different pain source, trauma, or even part of a centralized pain syndrome.Treatment options include pelvic floor physical therapy, local (vaginal) or oral  muscle relaxants, pelvic muscle trigger point injections or centrally acting pain medications.   - She is currently taking flexeril  and gabapentin. Will increase flexeril  to 10mg  TID.  - Referral placed to pelvic PT in [place] - Recommended further evaluation of back pain as this is likely contributing to muscle spasm symptoms.   Orders: -     AMB referral to  rehabilitation -     Cyclobenzaprine  HCl; Take 1 tablet (10 mg total) by mouth 3 (three) times daily.  Dispense: 90 tablet; Refill: 2  Urinary frequency -     POCT URINALYSIS DIP (CLINITEK)  SUI (stress urinary incontinence, female) Assessment & Plan: - For treatment of stress urinary incontinence,  non-surgical options include expectant management, weight loss, physical therapy, as well as a pessary.  Surgical options include a midurethral sling, Burch urethropexy, and transurethral injection of a bulking agent. - Recommended weight loss and physical therapy   BMI 40.0-44.9, adult (HCC) Assessment & Plan: - Has not seen weight loss specialist. We discussed they may be able to offer GLP-1 as she also has DM which is not well controlled.  - Discussed that weight loss can help improve muscle strain and incontinence  Orders: -     Amb Ref to Medical Weight Management  Right leg weakness Assessment & Plan: - Advised patient to present to ER with worsening back pain or onset of leg weakness as this can be a sign of nerve impingement.  - She will follow up with PCP for further evaluation/ imaging   Chronic idiopathic constipation Assessment & Plan: For constipation, we reviewed the importance of a better bowel regimen.  We also discussed the importance of avoiding chronic straining, as it can exacerbate her pelvic floor symptoms. We discussed initiating therapy with increasing fluid intake, fiber supplementation, and laxatives such as miralax daily    Return  2 months   Sandy LOISE Caper, MD        [1]  Social History Tobacco Use   Smoking status: Former    Current packs/day: 0.00    Types: Cigarettes    Quit date: 01/04/2014    Years since quitting: 10.3    Passive exposure: Never   Smokeless tobacco: Never  Vaping Use   Vaping status: Never Used  Substance Use Topics   Alcohol use: No   Drug use: No   "

## 2024-05-18 NOTE — Assessment & Plan Note (Signed)
-   Has not seen weight loss specialist. We discussed they may be able to offer GLP-1 as she also has DM which is not well controlled.  - Discussed that weight loss can help improve muscle strain and incontinence

## 2024-05-18 NOTE — Assessment & Plan Note (Signed)
-   Advised patient to present to ER with worsening back pain or onset of leg weakness as this can be a sign of nerve impingement.  - She will follow up with PCP for further evaluation/ imaging

## 2024-05-18 NOTE — Assessment & Plan Note (Signed)
-   The origin of pelvic floor muscle spasm can be multifactorial, including primary, reactive to a different pain source, trauma, or even part of a centralized pain syndrome.Treatment options include pelvic floor physical therapy, local (vaginal) or oral  muscle relaxants, pelvic muscle trigger point injections or centrally acting pain medications.   - She is currently taking flexeril  and gabapentin. Will increase flexeril  to 10mg  TID.  - Referral placed to pelvic PT in [place] - Recommended further evaluation of back pain as this is likely contributing to muscle spasm symptoms.

## 2024-05-18 NOTE — Patient Instructions (Addendum)
 Constipation: Our goal is to achieve formed bowel movements daily or every-other-day.  You may need to try different combinations of the following options to find what works best for you - everybody's body works differently so feel free to adjust the dosages as needed.  Some options to help maintain bowel health include:  Dietary changes (more leafy greens, vegetables and fruits; less processed foods) Fiber supplementation (Benefiber, FiberCon, Metamucil or Psyllium). Start slow and increase gradually to full dose. Over-the-counter agents such as: stool softeners (Docusate or Colace) and/or laxatives (Miralax, milk of magnesia)- Take Miralax daily Power Pudding is a natural mixture that may help your constipation.  To make blend 1 cup applesauce, 1 cup wheat bran, and 3/4 cup prune juice, refrigerate and then take 1 tablespoon daily with a large glass of water as needed.   Pelvic PT [facility] [phone]

## 2024-05-18 NOTE — Assessment & Plan Note (Signed)
-   For treatment of stress urinary incontinence,  non-surgical options include expectant management, weight loss, physical therapy, as well as a pessary.  Surgical options include a midurethral sling, Burch urethropexy, and transurethral injection of a bulking agent. - Recommended weight loss and physical therapy

## 2024-08-03 ENCOUNTER — Ambulatory Visit: Admitting: Obstetrics and Gynecology
